# Patient Record
Sex: Female | Born: 1960
Health system: Southern US, Community
[De-identification: ages and names within clinical notes are randomized; demographics above are authoritative.]

## PROBLEM LIST (undated history)

## (undated) DIAGNOSIS — F419 Anxiety disorder, unspecified: Secondary | ICD-10-CM

## (undated) DIAGNOSIS — K219 Gastro-esophageal reflux disease without esophagitis: Secondary | ICD-10-CM

## (undated) DIAGNOSIS — G4700 Insomnia, unspecified: Secondary | ICD-10-CM

## (undated) DIAGNOSIS — I10 Essential (primary) hypertension: Secondary | ICD-10-CM

## (undated) DIAGNOSIS — M8580 Other specified disorders of bone density and structure, unspecified site: Secondary | ICD-10-CM

## (undated) HISTORY — DX: Anxiety disorder, unspecified: F41.9

## (undated) HISTORY — PX: APPENDECTOMY: SHX54

## (undated) HISTORY — DX: Insomnia, unspecified: G47.00

## (undated) HISTORY — DX: Other specified disorders of bone density and structure, unspecified site: M85.80

## (undated) HISTORY — DX: Gastro-esophageal reflux disease without esophagitis: K21.9

---

## 2001-06-06 ENCOUNTER — Other Ambulatory Visit: Admission: RE | Admit: 2001-06-06 | Discharge: 2001-06-06 | Payer: Self-pay | Admitting: Obstetrics and Gynecology

## 2001-08-10 ENCOUNTER — Encounter: Admission: RE | Admit: 2001-08-10 | Discharge: 2001-08-10 | Payer: Self-pay | Admitting: Obstetrics and Gynecology

## 2001-08-10 ENCOUNTER — Encounter: Payer: Self-pay | Admitting: Obstetrics and Gynecology

## 2001-12-12 ENCOUNTER — Encounter: Payer: Self-pay | Admitting: Obstetrics and Gynecology

## 2001-12-12 ENCOUNTER — Encounter: Admission: RE | Admit: 2001-12-12 | Discharge: 2001-12-12 | Payer: Self-pay | Admitting: Obstetrics and Gynecology

## 2003-01-09 ENCOUNTER — Other Ambulatory Visit: Admission: RE | Admit: 2003-01-09 | Discharge: 2003-01-09 | Payer: Self-pay | Admitting: Obstetrics and Gynecology

## 2003-01-23 ENCOUNTER — Encounter: Admission: RE | Admit: 2003-01-23 | Discharge: 2003-01-23 | Payer: Self-pay | Admitting: Obstetrics and Gynecology

## 2003-07-12 ENCOUNTER — Encounter: Admission: RE | Admit: 2003-07-12 | Discharge: 2003-07-12 | Payer: Self-pay | Admitting: Specialist

## 2004-02-28 ENCOUNTER — Encounter: Admission: RE | Admit: 2004-02-28 | Discharge: 2004-02-28 | Payer: Self-pay | Admitting: Obstetrics and Gynecology

## 2004-03-31 ENCOUNTER — Other Ambulatory Visit: Admission: RE | Admit: 2004-03-31 | Discharge: 2004-03-31 | Payer: Self-pay | Admitting: Obstetrics and Gynecology

## 2004-06-24 ENCOUNTER — Ambulatory Visit: Payer: Self-pay | Admitting: Family Medicine

## 2004-06-30 ENCOUNTER — Ambulatory Visit: Payer: Self-pay | Admitting: Family Medicine

## 2004-07-15 ENCOUNTER — Ambulatory Visit: Payer: Self-pay | Admitting: Family Medicine

## 2004-07-30 ENCOUNTER — Ambulatory Visit: Payer: Self-pay | Admitting: Family Medicine

## 2004-09-01 ENCOUNTER — Ambulatory Visit: Payer: Self-pay | Admitting: Family Medicine

## 2004-11-27 ENCOUNTER — Ambulatory Visit: Payer: Self-pay | Admitting: Family Medicine

## 2004-12-02 ENCOUNTER — Ambulatory Visit: Payer: Self-pay

## 2004-12-09 ENCOUNTER — Ambulatory Visit: Payer: Self-pay | Admitting: Licensed Clinical Social Worker

## 2004-12-14 ENCOUNTER — Ambulatory Visit: Payer: Self-pay | Admitting: Licensed Clinical Social Worker

## 2004-12-23 ENCOUNTER — Ambulatory Visit: Payer: Self-pay | Admitting: Licensed Clinical Social Worker

## 2004-12-30 ENCOUNTER — Ambulatory Visit: Payer: Self-pay | Admitting: Licensed Clinical Social Worker

## 2005-01-12 ENCOUNTER — Ambulatory Visit: Payer: Self-pay | Admitting: Licensed Clinical Social Worker

## 2005-02-03 ENCOUNTER — Ambulatory Visit: Payer: Self-pay | Admitting: Licensed Clinical Social Worker

## 2005-03-02 ENCOUNTER — Ambulatory Visit: Payer: Self-pay | Admitting: Licensed Clinical Social Worker

## 2005-04-06 ENCOUNTER — Encounter: Admission: RE | Admit: 2005-04-06 | Discharge: 2005-04-06 | Payer: Self-pay | Admitting: Obstetrics and Gynecology

## 2006-08-16 ENCOUNTER — Ambulatory Visit: Payer: Self-pay | Admitting: Family Medicine

## 2006-08-16 DIAGNOSIS — F411 Generalized anxiety disorder: Secondary | ICD-10-CM

## 2006-08-16 DIAGNOSIS — R42 Dizziness and giddiness: Secondary | ICD-10-CM

## 2006-08-16 DIAGNOSIS — Z860100 Personal history of colon polyps, unspecified: Secondary | ICD-10-CM | POA: Insufficient documentation

## 2006-08-18 LAB — CONVERTED CEMR LAB
Albumin: 3.9 g/dL (ref 3.5–5.2)
Alkaline Phosphatase: 60 units/L (ref 39–117)
Basophils Absolute: 0 10*3/uL (ref 0.0–0.1)
Basophils Relative: 0.1 % (ref 0.0–1.0)
Bilirubin, Direct: 0.1 mg/dL (ref 0.0–0.3)
CO2: 29 meq/L (ref 19–32)
Creatinine, Ser: 0.8 mg/dL (ref 0.4–1.2)
Eosinophils Absolute: 0.1 10*3/uL (ref 0.0–0.6)
Eosinophils Relative: 1.2 % (ref 0.0–5.0)
GFR calc Af Amer: 100 mL/min
Hemoglobin: 12.1 g/dL (ref 12.0–15.0)
MCHC: 33.9 g/dL (ref 30.0–36.0)
Monocytes Absolute: 0.3 10*3/uL (ref 0.2–0.7)
Monocytes Relative: 4.3 % (ref 3.0–11.0)
Neutrophils Relative %: 68.7 % (ref 43.0–77.0)
Potassium: 4.4 meq/L (ref 3.5–5.1)
RBC: 4.3 M/uL (ref 3.87–5.11)
Sodium: 139 meq/L (ref 135–145)
WBC: 7.4 10*3/uL (ref 4.5–10.5)

## 2006-09-02 ENCOUNTER — Encounter: Admission: RE | Admit: 2006-09-02 | Discharge: 2006-09-02 | Payer: Self-pay | Admitting: Obstetrics and Gynecology

## 2006-09-25 ENCOUNTER — Encounter: Payer: Self-pay | Admitting: Internal Medicine

## 2007-02-21 ENCOUNTER — Ambulatory Visit: Payer: Self-pay | Admitting: Family Medicine

## 2007-02-21 DIAGNOSIS — J019 Acute sinusitis, unspecified: Secondary | ICD-10-CM

## 2007-04-17 ENCOUNTER — Telehealth: Payer: Self-pay | Admitting: Family Medicine

## 2007-09-07 ENCOUNTER — Encounter: Admission: RE | Admit: 2007-09-07 | Discharge: 2007-09-07 | Payer: Self-pay | Admitting: Obstetrics and Gynecology

## 2007-10-12 ENCOUNTER — Telehealth: Payer: Self-pay | Admitting: Family Medicine

## 2007-10-16 ENCOUNTER — Ambulatory Visit: Payer: Self-pay | Admitting: Family Medicine

## 2007-10-16 DIAGNOSIS — G4700 Insomnia, unspecified: Secondary | ICD-10-CM

## 2008-04-09 ENCOUNTER — Telehealth: Payer: Self-pay | Admitting: Family Medicine

## 2008-08-19 ENCOUNTER — Ambulatory Visit: Payer: Self-pay | Admitting: Family Medicine

## 2008-09-09 ENCOUNTER — Encounter: Admission: RE | Admit: 2008-09-09 | Discharge: 2008-09-09 | Payer: Self-pay | Admitting: Obstetrics and Gynecology

## 2008-09-24 ENCOUNTER — Ambulatory Visit: Payer: Self-pay | Admitting: Family Medicine

## 2008-09-26 LAB — CONVERTED CEMR LAB
Alkaline Phosphatase: 51 units/L (ref 39–117)
Basophils Relative: 0.3 % (ref 0.0–3.0)
Bilirubin Urine: NEGATIVE
Blood in Urine, dipstick: NEGATIVE
CO2: 30 meq/L (ref 19–32)
Chloride: 104 meq/L (ref 96–112)
Cholesterol: 179 mg/dL (ref 0–200)
Eosinophils Absolute: 0.1 10*3/uL (ref 0.0–0.7)
Eosinophils Relative: 2.7 % (ref 0.0–5.0)
GFR calc non Af Amer: 81.81 mL/min (ref >60)
Glucose, Urine, Semiquant: NEGATIVE
HCT: 38.3 % (ref 36.0–46.0)
HDL: 67.5 mg/dL (ref >39.00)
Lymphocytes Relative: 30.4 % (ref 12.0–46.0)
MCHC: 34 g/dL (ref 30.0–36.0)
MCV: 85.6 fL (ref 78.0–100.0)
Monocytes Relative: 5.4 % (ref 3.0–12.0)
Neutro Abs: 2.8 10*3/uL (ref 1.4–7.7)
Potassium: 3.9 meq/L (ref 3.5–5.1)
RBC: 4.47 M/uL (ref 3.87–5.11)
Specific Gravity, Urine: 1.015
TSH: 1.71 microintl units/mL (ref 0.35–5.50)
Total Bilirubin: 0.8 mg/dL (ref 0.3–1.2)
Total CHOL/HDL Ratio: 3
Total Protein: 7.1 g/dL (ref 6.0–8.3)
VLDL: 9.4 mg/dL (ref 0.0–40.0)

## 2008-10-08 ENCOUNTER — Ambulatory Visit: Payer: Self-pay | Admitting: Family Medicine

## 2008-10-08 DIAGNOSIS — N390 Urinary tract infection, site not specified: Secondary | ICD-10-CM

## 2008-10-08 DIAGNOSIS — Z862 Personal history of diseases of the blood and blood-forming organs and certain disorders involving the immune mechanism: Secondary | ICD-10-CM

## 2008-10-08 DIAGNOSIS — Z8639 Personal history of other endocrine, nutritional and metabolic disease: Secondary | ICD-10-CM

## 2008-10-10 ENCOUNTER — Telehealth: Payer: Self-pay | Admitting: Family Medicine

## 2008-10-10 LAB — CONVERTED CEMR LAB
Alkaline Phosphatase: 49 units/L (ref 39–117)
Ferritin: 8.8 ng/mL — ABNORMAL LOW (ref 10.0–291.0)
Hep B Core Total Ab: NEGATIVE
Hepatitis B Surface Ag: NEGATIVE
Total Protein: 7.3 g/dL (ref 6.0–8.3)

## 2008-10-15 ENCOUNTER — Encounter: Admission: RE | Admit: 2008-10-15 | Discharge: 2008-10-15 | Payer: Self-pay | Admitting: Family Medicine

## 2008-10-29 ENCOUNTER — Telehealth: Payer: Self-pay | Admitting: Family Medicine

## 2008-12-05 ENCOUNTER — Ambulatory Visit: Payer: Self-pay | Admitting: Family Medicine

## 2008-12-25 ENCOUNTER — Ambulatory Visit: Payer: Self-pay | Admitting: Family Medicine

## 2008-12-31 LAB — CONVERTED CEMR LAB
AST: 22 units/L (ref 0–37)
Albumin: 3.7 g/dL (ref 3.5–5.2)
Bilirubin, Direct: 0.1 mg/dL (ref 0.0–0.3)
Total Bilirubin: 0.7 mg/dL (ref 0.3–1.2)
Total Protein: 6.6 g/dL (ref 6.0–8.3)

## 2009-03-07 ENCOUNTER — Telehealth: Payer: Self-pay | Admitting: Family Medicine

## 2009-03-25 ENCOUNTER — Ambulatory Visit: Payer: Self-pay | Admitting: Sports Medicine

## 2009-03-25 DIAGNOSIS — M79609 Pain in unspecified limb: Secondary | ICD-10-CM | POA: Insufficient documentation

## 2009-03-25 DIAGNOSIS — M6740 Ganglion, unspecified site: Secondary | ICD-10-CM | POA: Insufficient documentation

## 2009-03-25 DIAGNOSIS — M25539 Pain in unspecified wrist: Secondary | ICD-10-CM | POA: Insufficient documentation

## 2009-04-28 ENCOUNTER — Ambulatory Visit: Payer: Self-pay | Admitting: Sports Medicine

## 2009-04-28 DIAGNOSIS — Q667 Congenital pes cavus, unspecified foot: Secondary | ICD-10-CM | POA: Insufficient documentation

## 2009-05-29 ENCOUNTER — Ambulatory Visit: Payer: Self-pay | Admitting: Sports Medicine

## 2009-06-05 ENCOUNTER — Emergency Department (HOSPITAL_COMMUNITY): Admission: EM | Admit: 2009-06-05 | Discharge: 2009-06-05 | Payer: Self-pay | Admitting: Emergency Medicine

## 2009-06-06 ENCOUNTER — Ambulatory Visit: Payer: Self-pay | Admitting: Family Medicine

## 2009-06-06 DIAGNOSIS — G43909 Migraine, unspecified, not intractable, without status migrainosus: Secondary | ICD-10-CM | POA: Insufficient documentation

## 2009-08-06 ENCOUNTER — Telehealth: Payer: Self-pay | Admitting: Family Medicine

## 2010-01-13 ENCOUNTER — Telehealth: Payer: Self-pay | Admitting: Family Medicine

## 2010-02-24 NOTE — Progress Notes (Signed)
Summary: Refill Request  Phone Note Refill Request Message from:  Patient on March 07, 2009 8:52 AM  Refills Requested: Medication #1:  TEMAZEPAM 30 MG  CAPS one tab at HS.   Dosage confirmed as above?[facility] [address]  Initial call taken by: Harold Barban,  March 07, 2009 8:53 AM  Follow-up for Phone Call        call in #30 with 5 rf Follow-up by: Nelwyn Salisbury MD,  March 07, 2009 10:18 AM  Additional Follow-up for Phone Call Additional follow up Details #1::        ok done pls let know called in  Additional Follow-up by: Pura Spice, RN,  March 07, 2009 11:16 AM    Prescriptions: TEMAZEPAM 30 MG  CAPS (TEMAZEPAM) one tab at HS  #30 x 5   Entered by:   Pura Spice, RN   Authorized by:   Nelwyn Salisbury MD   Signed by:   Lynann Beaver CMA on 03/07/2009   Method used:   Telephoned to ...       [facility]  [phone]* (retail)       [address] [place], [place]  [address]       Ph: [phone] or [phone]       Fax: [phone]   RxID:   [id number]

## 2010-02-24 NOTE — Progress Notes (Signed)
Summary: refill Temazepam  Phone Note Refill Request Message from:  Fax from Pharmacy on August 06, 2009 11:42 AM  Refills Requested: Medication #1:  TEMAZEPAM 30 MG  CAPS one tab at HS   Dosage confirmed as above?Dosage Confirmed   Supply Requested: 1 month   Last Refilled: 08/01/2009   Notes: early refill requested  Method Requested: Fax to Local Pharmacy Initial call taken by: Raechel Ache, RN,  August 06, 2009 11:43 AM Caller: [facility] [place]  [phone]*  Follow-up for Phone Call        call in #30 with 5 rf Follow-up by: Nelwyn Salisbury MD,  August 06, 2009 6:02 PM  Additional Follow-up for Phone Call Additional follow up Details #1::        Rx faxed to pharmacy Additional Follow-up by: Raechel Ache, RN,  August 07, 2009 8:15 AM    Prescriptions: TEMAZEPAM 30 MG  CAPS (TEMAZEPAM) one tab at HS  #30 x 5   Entered by:   Raechel Ache, RN   Authorized by:   Nelwyn Salisbury MD   Signed by:   Raechel Ache, RN on 08/07/2009   Method used:   Historical   RxID:   [id number]

## 2010-02-24 NOTE — Assessment & Plan Note (Signed)
Summary: L GREAT TOE PAIN  AND R WRIST PAIN X ONE YEAR   Vital Signs:  Patient profile:   50 year old female BP sitting:   138 / 78  Vitals Entered By: Lillia Pauls CMA (March 25, 2009 8:55 AM)  CC:  left big toe pain.  History of Present Illness: Pt presents c/o left hallux pain since valentine's day, about 2 weeks ago.   One year ago she had a stress fracture in the same area that was diagnosed by a podiatrist, at that time she said she was playing tennis one night, and the next morning she could not walk on the foot because of pain, podiatrist did xrays and put her in a padded shoe for 8 weeks.  The toe has been better until Valentines day when she went out and wore high heels.  The next morning she felt a soreness similar to the fracture before.  Then she played tennise 1 or 2 days later, had a good deal of swelling and pain, she used some ice that night.  Since then pain is coming and going, pulsing ache.  She is concerned she has fx it again. She's been avoiding pounding exercises for the past week or 2.   Plays recreational doubles tennis, season starts next week.    Also c/o a bump on her right wrist for a year or so, doesn't hurt much just annoying.  Allergies: No Known Drug Allergies  Past History:  Past Medical History: Last updated: 10/08/2008 Anxiety insomnia sees Dr. Rana Snare for GYN exams  Social History: Reviewed history from 10/16/2007 and no changes required. Married Never Smoked Alcohol use-yes Drug use-no  Review of Systems MS:  Complains of joint pain and joint swelling; denies muscle weakness. Neuro:  Denies numbness, tingling, and weakness.  Physical Exam  General:  Well-developed,well-nourished,in no acute distress; alert,appropriate and cooperative throughout examination Msk:  mild to moderate hallux valgus of both feet Pes cavus b/l large callus on the bottom of the forefoot at the area of the 1st MTP very mild TTP along the distal 1st met from  lateral to dorsal has full ROM in flexion and extension of the toes  U/S shows old medial sesamoid fx healed, small effusion around 1st MTP joint    right radiocarpal joint has small ganglion cyst  This is also confirmed wioth US exam the ganglion communicates with radiocarpal joint and is compressible Neurologic:  strength 5/5 LLE Psych:  Cognition and judgment appear intact. Alert and cooperative with normal attention span and concentration.   Impression & Recommendations:  Problem # 1:  TOE PAIN (ICD-729.5)  left old healed sesamoid fracture probably mild capsulitis around MTP joint  made comforothotic with a sesamoid cut-out to try and take pressure off the area return in 4 weeks for custom orthotics if helping  Orders: [place] LIMITED ([id number])  Problem # 2:  GANGLION CYST, WRIST, RIGHT (ICD-727.41)  clearly connects to joint on u/s gave thumb loop to use while playing tennis and other physical activities  Orders: [place] LIMITED ([id number])  Complete Medication List: 1)  Sertraline Hcl 50 Mg Tabs (Sertraline hcl) .... Once daily 2)  Temazepam 30 Mg Caps (Temazepam) .... One tab at hs  Other Orders: Elastic wrist with thumb loop ([id number])

## 2010-02-24 NOTE — Assessment & Plan Note (Signed)
Summary: fu on anxiety/njr   Vital Signs:  Patient profile:   50 year old female Weight:      128 pounds Temp:     98.6 degrees F oral Resp:     12 per minute BP sitting:   110 / 80  Vitals Entered By: Lynann Beaver CMA (Jun 06, 2009 10:44 AM) CC: Pt seen in ER yesterday with migraine and instructed to see Dr. Clent Ridges today Is Patient Diabetic? No Pain Assessment Patient in pain? no        History of Present Illness: Here for refills on her anxiety med and for HAs. She has frequent HAs, but sometimes thye evolve to where she is lightheaded, nauseated,and has blurred vision. She has never been diagnosed with migraines though until a recent ER visit for one of these HAs. She was told it was a migraine. She had stopped taking Zoloft about 6 months ago, but her anxiety has crept back up. She sleeps well with temazepam.   Current Medications (verified): 1)  Temazepam 30 Mg  Caps (Temazepam) .... One Tab At [facility]  Allergies (verified): No Known Drug Allergies  Past History:  Past Medical History: Reviewed history from 10/08/2008 and no changes required. Anxiety insomnia sees Dr. Rana Snare for GYN exams  Past Surgical History: Reviewed history from 08/16/2006 and no changes required. Appendectomy  Family History: Reviewed history from 10/16/2007 and no changes required. Family History of CAD Female 1st degree relative <50 Family History Diabetes 1st degree relative Family History High cholesterol Family History Hypertension Family History of Stroke F 1st degree relative <60  Review of Systems  The patient denies anorexia, fever, weight loss, weight gain, vision loss, decreased hearing, hoarseness, chest pain, syncope, dyspnea on exertion, peripheral edema, prolonged cough, hemoptysis, abdominal pain, melena, hematochezia, severe indigestion/heartburn, hematuria, incontinence, genital sores, muscle weakness, suspicious skin lesions, transient blindness, difficulty walking, depression,  unusual weight change, abnormal bleeding, enlarged lymph nodes, angioedema, breast masses, and testicular masses.    Physical Exam  General:  Well-developed,well-nourished,in no acute distress; alert,appropriate and cooperative throughout examination Head:  Normocephalic and atraumatic without obvious abnormalities. No apparent alopecia or balding. Eyes:  No corneal or conjunctival inflammation noted. EOMI. Perrla. Funduscopic exam benign, without hemorrhages, exudates or papilledema. Vision grossly normal. Ears:  External ear exam shows no significant lesions or deformities.  Otoscopic examination reveals clear canals, tympanic membranes are intact bilaterally without bulging, retraction, inflammation or discharge. Hearing is grossly normal bilaterally. Nose:  External nasal examination shows no deformity or inflammation. Nasal mucosa are pink and moist without lesions or exudates. Mouth:  Oral mucosa and oropharynx without lesions or exudates.  Teeth in good repair. Neck:  No deformities, masses, or tenderness noted. Lungs:  Normal respiratory effort, chest expands symmetrically. Lungs are clear to auscultation, no crackles or wheezes. Heart:  Normal rate and regular rhythm. S1 and S2 normal without gallop, murmur, click, rub or other extra sounds. Neurologic:  alert & oriented X3, cranial nerves II-XII intact, and gait normal.   Psych:  Oriented X3, memory intact for recent and remote, normally interactive, good eye contact, and moderately anxious.     Impression & Recommendations:  Problem # 1:  MIGRAINE HEADACHE (ICD-346.90)  Her updated medication list for this problem includes:    Imitrex 100 Mg Tabs (Sumatriptan succinate) ..... One as needed for migraines    Aspirin 81 Mg Tbec (Aspirin) ..... Once daily  Problem # 2:  ANXIETY (ICD-300.00)  The following medications were removed from the  medication list:    Sertraline Hcl 50 Mg Tabs (Sertraline hcl) ..... Once daily Her updated  medication list for this problem includes:    Zoloft 50 Mg Tabs (Sertraline hcl) ..... Once daily  Complete Medication List: 1)  Temazepam 30 Mg Caps (Temazepam) .... One tab at hs 2)  Zoloft 50 Mg Tabs (Sertraline hcl) .... Once daily 3)  Imitrex 100 Mg Tabs (Sumatriptan succinate) .... One as needed for migraines 4)  Aspirin 81 Mg Tbec (Aspirin) .... Once daily  Patient Instructions: 1)  Please schedule a follow-up appointment in 1 month.  Prescriptions: IMITREX 100 MG TABS (SUMATRIPTAN SUCCINATE) one as needed for migraines  #12 x 5   Entered and Authorized by:   Nelwyn Salisbury MD   Signed by:   Nelwyn Salisbury MD on 06/06/2009   Method used:   Electronically to        [facility]  [phone]* (retail)       [address] [place], [place]  [address]       Ph: [phone] or [phone]       Fax: [phone]   RxID:   [id number] ZOLOFT 50 MG TABS (SERTRALINE HCL) once daily  #30 x 5   Entered and Authorized by:   Nelwyn Salisbury MD   Signed by:   Nelwyn Salisbury MD on 06/06/2009   Method used:   Electronically to        [facility]  [phone]* (retail)       [address] [place], [place]  [address]       Ph: [phone] or [phone]       Fax: [phone]   RxID:   [id number]

## 2010-02-24 NOTE — Assessment & Plan Note (Signed)
Summary: orthotcis,mc   Vital Signs:  Patient profile:   51 year old female BP sitting:   146 / 89  Vitals Entered By: Lillia Pauls CMA (May 29, 2009 9:21 AM)  History of Present Illness: 50 yo F here for custom orthotics  Patient with capsulitis of 1st MTP and old healed sesamoid fracture Having pain at this area and callus formation on plantar aspect of foot L side. Foot and arch pain have improved. Doing well in temporary sports insoles with scaphoid pads and buildup behind sesamoids. Plays tennis twice a week and goes to work out 2x/week. Has some tingling after tennis matches occasionally from 1st MTP to medial long arch.  Allergies (verified): No Known Drug Allergies  Physical Exam  General:  Well-developed,well-nourished,in no acute distress; alert,appropriate and cooperative throughout examination Msk:  cavus feet minimal pain now at first MTP  bilat some pain along medial arch but not TTP good motion of first MTP bilat thick callus at first MTP FROM all toes, no hallux rigidus   Impression & Recommendations:  Problem # 1:  TOE PAIN (ICD-729.5) Assessment Improved  Patient was fitted for a : standard, cushioned, semi-rigid orthotic. The orthotic was heated and afterward the patient stood on the orthotic blank positioned on the orthotic stand. The patient was positioned in subtalar neutral position and 10 degrees of ankle dorsiflexion in a weight bearing stance. After completion of molding, a stable base was applied to the orthotic blank. The blank was ground to a stable position for weight bearing. Size: 9 red cambray Base: blue med density eva Posting: none Additional orthotic padding: sesamoid cutout on left, padding behind 1st MTP on R Total prep time 40 minutes  Patient felt more comfortable in these, less pressure on 1st MTP.  Lands in neutral now but lands flat footed - this will provide her much more support and cushion.  Orders: [employer],  each unit [phone])  Problem # 2:  TALIPES CAVUS (ICD-754.71) Assessment: Improved  see #1 above.  Orders: [employer], each unit [phone])  Complete Medication List: 1)  Sertraline Hcl 50 Mg Tabs (Sertraline hcl) .... Once daily 2)  Temazepam 30 Mg Caps (Temazepam) .... One tab at hs

## 2010-02-24 NOTE — Assessment & Plan Note (Signed)
Summary: F/U,MC   Vital Signs:  Patient profile:   50 year old female BP sitting:   137 / 84  Vitals Entered By: Lillia Pauls CMA (April 28, 2009 8:52 AM)  History of Present Illness: Pt sts foot is feeling a lot better and much improved from before. She is still playing tennis at least twice a week and also doing some biking for therapy. Does have some tingling after tennis matches from time-to-time. this is   from 1st MTP to med aspect of long arch would like some additional arch support as this helps but does not seem that high in sports insoles   Wrist is better; not fully healed but tolerable  Allergies: No Known Drug Allergies  Physical Exam  General:  Well-developed,well-nourished,in no acute distress; alert,appropriate and cooperative throughout examination Msk:  cavus feet minimal pain now at first MTP  bilat some pain along medial arch but not TTP good motion of first MTP bilat thick callus at first MTP   Impression & Recommendations:  Problem # 1:  TOE PAIN (ICD-729.5) This is much less hurts in some everyday sheos but less w padded sports insoles some padding added to first MTP plantar surface in every day shoes given padding to try in reg shoes  Problem # 2:  TALIPES CAVUS (ICD-754.71) will need custom orthotics will test how much arch we can place by starting w scaphoid pads under sports insoles gait looks good in these  will return for permanent orthotics during next month  Problem # 3:  GANGLION CYST, WRIST, RIGHT (ICD-727.41) having less pain use wrist loop for tennis  Complete Medication List: 1)  Sertraline Hcl 50 Mg Tabs (Sertraline hcl) .... Once daily 2)  Temazepam 30 Mg Caps (Temazepam) .... One tab at hs

## 2010-02-26 NOTE — Progress Notes (Signed)
Summary: Pt req refills for generic Zoloft and Temazepam  Phone Note Refill Request Call back at Home Phone [phone] Message from:  Patient on January 13, 2010 8:58 AM  Refills Requested: Medication #1:  ZOLOFT 50 MG TABS once daily   Dosage confirmed as above?Dosage Confirmed   Brand Name Necessary? No  Medication #2:  TEMAZEPAM 30 MG  CAPS one tab at HS   Dosage confirmed as above?Dosage Confirmed [facility]s [facility]. Pt will sch ov in January. Insurance is changing.    Method Requested: Telephone to Pharmacy Initial call taken by: Lucy Antigua,  January 13, 2010 8:58 AM  Follow-up for Phone Call        call in 3 months of both  Follow-up by: Nelwyn Salisbury MD,  January 14, 2010 8:35 AM  Additional Follow-up for Phone Call Additional follow up Details #1::        done  pt aware Additional Follow-up by: Pura Spice, RN,  January 14, 2010 9:16 AM    New/Updated Medications: ZOLOFT 50 MG TABS (SERTRALINE HCL) once daily Prescriptions: ZOLOFT 50 MG TABS (SERTRALINE HCL) once daily  #30 x 3   Entered by:   Pura Spice, RN   Authorized by:   Nelwyn Salisbury MD   Signed by:   Pura Spice, RN on 01/14/2010   Method used:   Telephoned to ...       [facility]  [phone]* (retail)       [address] [place], [place]  [address]       Ph: [phone] or [phone]       Fax: [phone]   RxID:   [id number] TEMAZEPAM 30 MG  CAPS (TEMAZEPAM) one tab at HS  #30 x 3   Entered by:   Pura Spice, RN   Authorized by:   Nelwyn Salisbury MD   Signed by:   Pura Spice, RN on 01/14/2010   Method used:   Telephoned to ...       [facility]  [phone]* (retail)       [address] [place], [place]  [address]       Ph: [phone] or [phone]       Fax: [phone]   RxID:   [id number]

## 2010-03-09 ENCOUNTER — Other Ambulatory Visit: Payer: Self-pay

## 2010-03-09 DIAGNOSIS — G4700 Insomnia, unspecified: Secondary | ICD-10-CM

## 2010-03-09 MED ORDER — TEMAZEPAM 30 MG PO CAPS: 30.0000 mg | ORAL_CAPSULE | Freq: Every evening | ORAL | Status: DC | PRN

## 2010-03-09 NOTE — Telephone Encounter (Signed)
Ok for 6 months 

## 2010-03-09 NOTE — Telephone Encounter (Signed)
rx faxed [facility]

## 2010-03-20 ENCOUNTER — Encounter: Payer: Self-pay | Admitting: Family Medicine

## 2010-03-24 ENCOUNTER — Ambulatory Visit: Payer: Self-pay | Admitting: Family Medicine

## 2010-04-14 LAB — CBC
HCT: 35.1 % — ABNORMAL LOW (ref 36.0–46.0)
MCV: 82.2 fL (ref 78.0–100.0)
RBC: 4.27 MIL/uL (ref 3.87–5.11)
RDW: 14.1 % (ref 11.5–15.5)

## 2010-04-14 LAB — DIFFERENTIAL
Basophils Relative: 0 % (ref 0–1)
Eosinophils Absolute: 0.1 10*3/uL (ref 0.0–0.7)
Eosinophils Relative: 1 % (ref 0–5)
Lymphs Abs: 1.5 10*3/uL (ref 0.7–4.0)
Neutrophils Relative %: 71 % (ref 43–77)

## 2010-04-14 LAB — POCT I-STAT, CHEM 8
Chloride: 106 mEq/L (ref 96–112)
Glucose, Bld: 117 mg/dL — ABNORMAL HIGH (ref 70–99)
HCT: 40 % (ref 36.0–46.0)
Potassium: 3.5 mEq/L (ref 3.5–5.1)
Sodium: 140 mEq/L (ref 135–145)

## 2010-04-14 LAB — POCT CARDIAC MARKERS: Myoglobin, poc: 60.5 ng/mL (ref 12–200)

## 2010-05-05 ENCOUNTER — Ambulatory Visit (INDEPENDENT_AMBULATORY_CARE_PROVIDER_SITE_OTHER): Payer: BLUE CROSS/BLUE SHIELD | Admitting: Family Medicine

## 2010-05-05 ENCOUNTER — Encounter: Payer: Self-pay | Admitting: Family Medicine

## 2010-05-05 DIAGNOSIS — R079 Chest pain, unspecified: Secondary | ICD-10-CM

## 2010-05-05 DIAGNOSIS — F411 Generalized anxiety disorder: Secondary | ICD-10-CM

## 2010-05-05 DIAGNOSIS — F419 Anxiety disorder, unspecified: Secondary | ICD-10-CM

## 2010-05-05 MED ORDER — TEMAZEPAM 30 MG PO CAPS: 30.0000 mg | ORAL_CAPSULE | Freq: Every evening | ORAL | Status: DC | PRN

## 2010-05-05 MED ORDER — SERTRALINE HCL 50 MG PO TABS: 50.0000 mg | ORAL_TABLET | Freq: Every day | ORAL | Status: DC

## 2010-05-05 NOTE — Progress Notes (Signed)
  Subjective:    Patient ID: Bethany Wade, female    DOB: August 14, 1960, 50 y.o.   MRN: [id number]  HPI Here to refill meds and to ask about running tests on her heart. She occasionally gets chest pains which we have felt were due to GERD in past years. These are not associated with exertion, and she has no SOB or nausea with them. She works out at [employer] and plays tennis almost every day, and none of these activities are limited by chest symptoms. Her anxiety has been well controlled.    Review of Systems  Constitutional: Negative.   Respiratory: Negative.   Cardiovascular: Positive for chest pain. Negative for palpitations and leg swelling.  Gastrointestinal: Negative.   Psychiatric/Behavioral: Negative.        Objective:   Physical Exam  Constitutional: She appears well-developed and well-nourished.  Neck: No thyromegaly present.  Cardiovascular: Normal rate, regular rhythm, normal heart sounds and intact distal pulses.  Exam reveals no gallop and no friction rub.   No murmur heard. Pulmonary/Chest: Effort normal and breath sounds normal.  Lymphadenopathy:    She has no cervical adenopathy.          Assessment & Plan:  Refilled her meds. I do not think her chest discomfort is cardiac in any way, but we will set her up for a stress test soon.

## 2010-05-06 ENCOUNTER — Other Ambulatory Visit (INDEPENDENT_AMBULATORY_CARE_PROVIDER_SITE_OTHER): Payer: BLUE CROSS/BLUE SHIELD

## 2010-05-06 DIAGNOSIS — Z0000 Encounter for general adult medical examination without abnormal findings: Secondary | ICD-10-CM

## 2010-05-06 LAB — CBC WITH DIFFERENTIAL/PLATELET
Eosinophils Absolute: 0.2 10*3/uL (ref 0.0–0.7)
Hemoglobin: 13.2 g/dL (ref 12.0–15.0)
Lymphocytes Relative: 34.1 % (ref 12.0–46.0)
Lymphs Abs: 1.4 10*3/uL (ref 0.7–4.0)
Monocytes Absolute: 0.3 10*3/uL (ref 0.1–1.0)
Monocytes Relative: 6 % (ref 3.0–12.0)
Neutro Abs: 2.3 10*3/uL (ref 1.4–7.7)
Neutrophils Relative %: 55 % (ref 43.0–77.0)
RBC: 4.58 Mil/uL (ref 3.87–5.11)
RDW: 15.1 % — ABNORMAL HIGH (ref 11.5–14.6)
WBC: 4.2 10*3/uL — ABNORMAL LOW (ref 4.5–10.5)

## 2010-05-06 LAB — BASIC METABOLIC PANEL
BUN: 14 mg/dL (ref 6–23)
Chloride: 103 mEq/L (ref 96–112)
GFR: 84.91 mL/min (ref >60.00)
Sodium: 140 mEq/L (ref 135–145)

## 2010-05-06 LAB — URINALYSIS
Hgb urine dipstick: NEGATIVE
Ketones, ur: NEGATIVE
Total Protein, Urine: NEGATIVE
Urine Glucose: NEGATIVE

## 2010-05-06 LAB — LIPID PANEL
Cholesterol: 190 mg/dL (ref 0–200)
LDL Cholesterol: 114 mg/dL — ABNORMAL HIGH (ref 0–99)

## 2010-05-06 LAB — HEPATIC FUNCTION PANEL
AST: 25 U/L (ref 0–37)
Total Protein: 7.1 g/dL (ref 6.0–8.3)

## 2010-05-12 ENCOUNTER — Telehealth: Payer: Self-pay

## 2010-05-12 NOTE — Telephone Encounter (Signed)
Message copied by Madison Hickman on Tue May 12, 2010  7:57 AM ------      Message from: Dwaine Deter      Created: Tue May 12, 2010  5:59 AM       normal

## 2010-05-12 NOTE — Telephone Encounter (Signed)
Pt aware.

## 2010-05-14 ENCOUNTER — Ambulatory Visit (INDEPENDENT_AMBULATORY_CARE_PROVIDER_SITE_OTHER): Payer: BC Managed Care – PPO | Admitting: Physician Assistant

## 2010-05-14 ENCOUNTER — Encounter: Payer: Self-pay | Admitting: Physician Assistant

## 2010-05-14 DIAGNOSIS — R079 Chest pain, unspecified: Secondary | ICD-10-CM

## 2010-05-14 NOTE — Progress Notes (Signed)
Exercise Treadmill Test  Pre-Exercise Testing Evaluation Rhythm: normal sinus  Rate: 76   PR:  .14 QRS:  .08    Test  Exercise Tolerance Test Ordering MD: Gershon Crane M.D  Interpreting MD:  Tereso Newcomer PA-C  Unique Test No: 1  Treadmill:  1  Indication for ETT: chest pain - rule out ischemia  Contraindication to ETT: No   Stress Modality: exercise - treadmill  Cardiac Imaging Performed: non   Protocol: standard Bruce - maximal  Max BP:  164/83  Max MPHR (bpm):  172 85% MPR (bpm):  146  MPHR obtained (bpm):162 % MPHR obtained: 95%  Reached 85% MPHR (min:sec):  7:45 Total Exercise Time (min-sec):11:50  Workload in METS:  14.9 Borg Scale: 17  Reason ETT Terminated:  patient's desire to stop    ST Segment Analysis At Rest: normal ST segments - no evidence of significant ST depression With Exercise: no evidence of significant ST depression  Other Information Arrhythmia:  No Angina during ETT:  absent (0) Quality of ETT:  diagnostic  ETT Interpretation:  normal - no evidence of ischemia by ST analysis  Comments: Excellent exercise tolerance. Normal BP response to exercise. No chest pain. No ECG changes to suggest ischemia.  Recommendations: Follow up with Dr. Clent Ridges as directed.

## 2010-05-29 ENCOUNTER — Telehealth: Payer: Self-pay | Admitting: Family Medicine

## 2010-05-29 NOTE — Telephone Encounter (Signed)
Pt called and has questions re: temazepam. Pls call back asap today.

## 2010-06-01 NOTE — Telephone Encounter (Signed)
There was no question concerning her Temazepam. Pt wanted to ask question concerning her Mother-in-law's diabetic medication. Pt was wanting Dr Clent Ridges to prescribe Rx for mother-in-law who is visiting from [place] & her trip has been extended until May 23,2012 and will run out of meds before that date; she had been to ER and was told that she would need to contact PCP. Explained that Pt would have to have NP OV and be assessed before MD could prescribe medication.  Informed Pt that it would probably be best to contact [place] MDs for this matter, as it would call for cross over on Insurance that probably would not cover a [place] visit. She is going to try and contact [place] physician, and will let us know if she needs to be seen by our office.

## 2010-09-17 ENCOUNTER — Telehealth: Payer: Self-pay | Admitting: Family Medicine

## 2010-09-17 NOTE — Telephone Encounter (Signed)
Refill request for Temazepam 30 mg capsule take 1 qhs prn. Pt last here on 05/14/10 and script last filled on 08/23/10.

## 2010-09-18 MED ORDER — TEMAZEPAM 30 MG PO CAPS: 30.0000 mg | ORAL_CAPSULE | Freq: Every evening | ORAL | Status: DC | PRN

## 2010-09-18 NOTE — Telephone Encounter (Signed)
Script called in

## 2010-09-18 NOTE — Telephone Encounter (Signed)
Call in #30 with 5 rf 

## 2010-11-30 ENCOUNTER — Ambulatory Visit (INDEPENDENT_AMBULATORY_CARE_PROVIDER_SITE_OTHER): Payer: Commercial Managed Care - PPO | Admitting: Family Medicine

## 2010-11-30 ENCOUNTER — Encounter: Payer: Self-pay | Admitting: Family Medicine

## 2010-11-30 VITALS — BP 136/86 | HR 70 | Temp 98.2°F | Ht 64.0 in | Wt 130.0 lb

## 2010-11-30 DIAGNOSIS — M79609 Pain in unspecified limb: Secondary | ICD-10-CM

## 2010-11-30 DIAGNOSIS — M79605 Pain in left leg: Secondary | ICD-10-CM

## 2010-11-30 NOTE — Patient Instructions (Signed)
You have a calf strain/partial tear (tennis leg) Compression sleeve or ace wrap to help with swelling and pain. Icing for 15 minutes at a time 3-4 times a day Elevate above the level of your heart when possible to keep swelling down. Heel lifts either in temporary orthotics or on their own to prevent further strain. Ibuprofen 3-4 tablets three times a day with food for pain, inflammation. Tylenol and/or aleve for pain. Heel raise exercise when able - 3 sets of 10 once a day on both legs - when able, do it while standing only on your left leg. Cycling with low resistance or swimming for cross training is ok now. Follow up with me in 2 weeks for a recheck. Bring your running/tennis shoes and your orthotics to that appointment.

## 2010-12-01 ENCOUNTER — Encounter: Payer: Self-pay | Admitting: Family Medicine

## 2010-12-01 DIAGNOSIS — M79605 Pain in left leg: Secondary | ICD-10-CM | POA: Insufficient documentation

## 2010-12-01 NOTE — Progress Notes (Signed)
Subjective:    Patient ID: Bethany Wade, female    DOB: Jul 30, 1960, 50 y.o.   MRN: [id number]  PCP: Gershon Crane MD  HPI 50 yo F here for left calf injury.  Patient reports she was playing tennis during [place] competition on Saturday. While playing in her last match during tiebreaker (was her final match of weekend) felt a pop in medial aspect of left calf proximally. Had difficulty walking immediately after this - used biofreeze and finished match but was mostly stationary. Had a lot of swelling and bruising following this in next 24 hours. Iced area a lot and elevated and has improved. Now able to walk without a limp but difficult to do a calf raise. No prior left calf injuries. Had problems with ROM of ankle in first 24 hours also but now has full motion. Taking ibuprofen.  Past Medical History  Diagnosis Date  . Anxiety   . Insomnia     Current Outpatient Prescriptions on File Prior to Visit  Medication Sig Dispense Refill  . aspirin 81 MG EC tablet Take 81 mg by mouth daily.        . sertraline (ZOLOFT) 50 MG tablet Take 1 tablet (50 mg total) by mouth daily.  30 tablet  11  . SUMAtriptan (IMITREX) 100 MG tablet Take 100 mg by mouth every 2 (two) hours as needed.        . temazepam (RESTORIL) 30 MG capsule Take 1 capsule (30 mg total) by mouth at bedtime as needed for Sleep.  30 capsule  5  . temazepam (RESTORIL) 30 MG capsule Take 1 capsule (30 mg total) by mouth at bedtime as needed.  30 capsule  5    Past Surgical History  Procedure Date  . Appendectomy     No Known Allergies  History   Social History  . Marital Status: Married    Spouse Name: N/A    Number of Children: N/A  . Years of Education: N/A   Occupational History  . Not on file.   Social History Main Topics  . Smoking status: Never Smoker   . Smokeless tobacco: Not on file  . Alcohol Use: Yes  . Drug Use: No  . Sexually Active: Not on file   Other Topics Concern  . Not on file    Social History Narrative  . No narrative on file    Family History  Problem Relation Age of Onset  . Coronary artery disease      fm hx  . Diabetes      fm hx  . Hyperlipidemia      fm hx  . Hypertension      fm hx  . Stroke      <60 fm hx  . Heart attack Father 55    mi; several brothers with SCD  . Diabetes Father   . Hyperlipidemia Father   . Sudden death Father   . Heart attack Sister   . Hyperlipidemia Sister   . Diabetes Mother   . Hyperlipidemia Mother   . Hyperlipidemia Brother   . Diabetes Paternal Aunt   . Heart attack Paternal Aunt   . Hyperlipidemia Paternal Aunt   . Hypertension Paternal Aunt   . Sudden death Paternal Aunt   . Diabetes Paternal Uncle   . Heart attack Paternal Uncle   . Hyperlipidemia Paternal Uncle   . Hypertension Paternal Uncle   . Sudden death Paternal Uncle   . Diabetes Maternal Grandfather   .  Sudden death Maternal Grandfather   . Hypertension Maternal Grandfather   . Hyperlipidemia Maternal Grandfather   . Heart attack Maternal Grandfather   . Diabetes Paternal Grandmother   . Sudden death Paternal Grandmother   . Hyperlipidemia Paternal Grandmother   . Heart attack Paternal Grandmother   . Hypertension Paternal Grandmother   . Diabetes Paternal Grandfather   . Sudden death Paternal Grandfather   . Heart attack Paternal Grandfather   . Hyperlipidemia Paternal Grandfather   . Hypertension Paternal Grandfather     BP 136/86  Pulse 70  Temp(Src) 98.2 F (36.8 C) (Oral)  Ht 5\' 4"  (1.626 m)  Wt 130 lb (58.968 kg)  BMI 22.31 kg/m2  Review of Systems See HPI above.    Objective:   Physical Exam Gen: NAD  Left lower leg: No gross deformity, swelling, bruising, erythema. TTP medial gastroc proximally but no palpable defect, muscle is soft.  No palpable cords.  No tenderness distal lower leg or at achilles/plantaris. FROM ankle with 5/5 strength but mild pain medial gastroc with full flexion and extension. Able to  do two-footed calf raise but not left one-footed calf raise. Negative thompsons. NVI distally.    Assessment & Plan:  1. Left medial gastroc partial tear/tennis leg - Calf sleeve provided and felt more comfortable with this.  Heel lifts to place in shoes.  Shown home strengthening exercises - start 3 sets of 10 on both legs, advance to left leg calf raises on level ground.  Recommended avoiding cutting sports for next 2 weeks at least - she is in a position where she can rest this now and prepare for spring competition.  Icing, elevation.  Consider formal PT but feel home exercises should be adequate.  Will reassess her progress in 2 weeks - anticipate advancing rehab and starting her back in walking, jogging at that time if continuing to improve with f/u at 6 weeks from injury.  See instructions for further.

## 2010-12-01 NOTE — Assessment & Plan Note (Signed)
Left medial gastroc partial tear/tennis leg - Calf sleeve provided and felt more comfortable with this.  Heel lifts to place in shoes.  Shown home strengthening exercises - start 3 sets of 10 on both legs, advance to left leg calf raises on level ground.  Recommended avoiding cutting sports for next 2 weeks at least - she is in a position where she can rest this now and prepare for spring competition.  Icing, elevation.  Consider formal PT but feel home exercises should be adequate.  Will reassess her progress in 2 weeks - anticipate advancing rehab and starting her back in walking, jogging at that time if continuing to improve with f/u at 6 weeks from injury.  See instructions for further.

## 2010-12-14 ENCOUNTER — Ambulatory Visit (INDEPENDENT_AMBULATORY_CARE_PROVIDER_SITE_OTHER): Payer: Commercial Managed Care - PPO | Admitting: Family Medicine

## 2010-12-14 ENCOUNTER — Encounter: Payer: Self-pay | Admitting: Family Medicine

## 2010-12-14 DIAGNOSIS — M25512 Pain in left shoulder: Secondary | ICD-10-CM | POA: Insufficient documentation

## 2010-12-14 DIAGNOSIS — M79609 Pain in unspecified limb: Secondary | ICD-10-CM

## 2010-12-14 DIAGNOSIS — M79605 Pain in left leg: Secondary | ICD-10-CM

## 2010-12-14 DIAGNOSIS — M25519 Pain in unspecified shoulder: Secondary | ICD-10-CM

## 2010-12-14 NOTE — Assessment & Plan Note (Signed)
Left shoulder rotator cuff impingement - theraband provided and home exercises shown.  Consider PT, injection if not improving as expected over next month.  Avoid overhead and reaching activities as much as possible.

## 2010-12-14 NOTE — Assessment & Plan Note (Signed)
Left medial gastroc partial tear/tennis leg - improved - continue calf sleeve regularly for next 4 weeks then for following 6 weeks with sports.  Continue home exercises - advance to using backpack and doing exercises on a step when she can comfortably do 3 x 10 single leg calf raises on level ground.  Can then start walk:jog program also.  Icing, tylenol as needed.  F/u in 1 month or prn.

## 2010-12-14 NOTE — Progress Notes (Signed)
Subjective:    Patient ID: Bethany Wade, female    DOB: 09-May-1960, 50 y.o.   MRN: [id number]  PCP: Gershon Crane MD  HPI  50 yo F here for f/u left calf injury.  11/5: Patient reports she was playing tennis during [place] competition on Saturday. While playing in her last match during tiebreaker (was her final match of weekend) felt a pop in medial aspect of left calf proximally. Had difficulty walking immediately after this - used biofreeze and finished match but was mostly stationary. Had a lot of swelling and bruising following this in next 24 hours. Iced area a lot and elevated and has improved. Now able to walk without a limp but difficult to do a calf raise. No prior left calf injuries. Had problems with ROM of ankle in first 24 hours also but now has full motion. Taking ibuprofen.  11/19: Patient has improved quite a bit from last visit. Now able to do single leg calf raise on left leg with mild pain but hard to do 3 sets of 10 due to pain. Wearing calf sleeve regularly. No longer taking medicines or icing the area. Wearing shoes with a good heel lift. Also noted today she's had left shoulder pain worse with overhead activities. No night pain. Is left handed. Worse when she was playing tennis before her calf injury, also worse with reaching behind her.  Past Medical History  Diagnosis Date  . Anxiety   . Insomnia     Current Outpatient Prescriptions on File Prior to Visit  Medication Sig Dispense Refill  . aspirin 81 MG EC tablet Take 81 mg by mouth daily.        . sertraline (ZOLOFT) 50 MG tablet Take 1 tablet (50 mg total) by mouth daily.  30 tablet  11  . SUMAtriptan (IMITREX) 100 MG tablet Take 100 mg by mouth every 2 (two) hours as needed.        . temazepam (RESTORIL) 30 MG capsule Take 1 capsule (30 mg total) by mouth at bedtime as needed for Sleep.  30 capsule  5  . temazepam (RESTORIL) 30 MG capsule Take 1 capsule (30 mg total) by mouth at bedtime as  needed.  30 capsule  5    Past Surgical History  Procedure Date  . Appendectomy     No Known Allergies  History   Social History  . Marital Status: Married    Spouse Name: N/A    Number of Children: N/A  . Years of Education: N/A   Occupational History  . Not on file.   Social History Main Topics  . Smoking status: Never Smoker   . Smokeless tobacco: Not on file  . Alcohol Use: Yes  . Drug Use: No  . Sexually Active: Not on file   Other Topics Concern  . Not on file   Social History Narrative  . No narrative on file    Family History  Problem Relation Age of Onset  . Coronary artery disease      fm hx  . Diabetes      fm hx  . Hyperlipidemia      fm hx  . Hypertension      fm hx  . Stroke      <60 fm hx  . Heart attack Father 79    mi; several brothers with SCD  . Diabetes Father   . Hyperlipidemia Father   . Sudden death Father   . Heart attack Sister   .  Hyperlipidemia Sister   . Diabetes Mother   . Hyperlipidemia Mother   . Hyperlipidemia Brother   . Diabetes Paternal Aunt   . Heart attack Paternal Aunt   . Hyperlipidemia Paternal Aunt   . Hypertension Paternal Aunt   . Sudden death Paternal Aunt   . Diabetes Paternal Uncle   . Heart attack Paternal Uncle   . Hyperlipidemia Paternal Uncle   . Hypertension Paternal Uncle   . Sudden death Paternal Uncle   . Diabetes Maternal Grandfather   . Sudden death Maternal Grandfather   . Hypertension Maternal Grandfather   . Hyperlipidemia Maternal Grandfather   . Heart attack Maternal Grandfather   . Diabetes Paternal Grandmother   . Sudden death Paternal Grandmother   . Hyperlipidemia Paternal Grandmother   . Heart attack Paternal Grandmother   . Hypertension Paternal Grandmother   . Diabetes Paternal Grandfather   . Sudden death Paternal Grandfather   . Heart attack Paternal Grandfather   . Hyperlipidemia Paternal Grandfather   . Hypertension Paternal Grandfather     BP 130/90  Pulse 78   Temp(Src) 98.2 F (36.8 C) (Oral)  Ht 5\' 4"  (1.626 m)  Wt 131 lb (59.421 kg)  BMI 22.49 kg/m2  Review of Systems  See HPI above.    Objective:   Physical Exam  Gen: NAD  Left lower leg: No gross deformity, swelling, bruising, erythema. Minimal TTP medial gastroc proximally but no palpable defect, muscle is soft.  No palpable cords.  No tenderness distal lower leg or at achilles/plantaris. FROM ankle with 5/5 strength without pain. Now able to do single leg calf raise on left. Negative thompsons. NVI distally.  L shoulder: No swelling, ecchymoses.  No gross deformity. No TTP. FROM. Mildly positive Hawkins, Neers.  + painful arc Negative Speeds, Yergasons. 5/5 strength with empty can and resisted internal/external rotation - mild pain with empty can and ER. NV intact distally.    Assessment & Plan:  1. Left medial gastroc partial tear/tennis leg - improved - continue calf sleeve regularly for next 4 weeks then for following 6 weeks with sports.  Continue home exercises - advance to using backpack and doing exercises on a step when she can comfortably do 3 x 10 single leg calf raises on level ground.  Can then start walk:jog program also.  Icing, tylenol as needed.  F/u in 1 month or prn.  2. Left shoulder rotator cuff impingement - theraband provided and home exercises shown.  Consider PT, injection if not improving as expected over next month.  Avoid overhead and reaching activities as much as possible.

## 2010-12-14 NOTE — Patient Instructions (Signed)
You are doing great in recovering from your calf strain. Continue wearing the sleeve for next 4 weeks as you have been THEN only wear it with sports for the following 6 weeks. Ice and tylenol/aleve as needed at this point. Continue with your exercises - 3 sets of 10 one leg - when you can do this without pain, add a backpack of weights and/or do it on a step as I showed you. When you get to this point also (one legged calf raises without pain), you can start a walk/jog program 1:1 minute for total 10 minutes - every other day increase total time by 5 minutes and jog time by 1-2 minutes per interval. Your orthotics are in pretty good shape - you will likely get another year out of these before you need a replacement - if they crack at all we can make you new ones.  You have rotator cuff impingement Try to avoid painful activities (overhead activities, lifting with extended arm) as much as possible. Aleve and/or tylenol as needed for pain Subacromial injection may be beneficial to help with pain and to decrease inflammation. Home exercise program as discussed with theraband and scapular stabilization exercises - these are very important for long term relief even if an injection was given. If not improving at follow-up we will consider ultrasound and/or physical therapy.

## 2011-03-17 ENCOUNTER — Encounter: Payer: Self-pay | Admitting: Family Medicine

## 2011-03-17 ENCOUNTER — Ambulatory Visit (INDEPENDENT_AMBULATORY_CARE_PROVIDER_SITE_OTHER): Payer: Commercial Managed Care - PPO | Admitting: Family Medicine

## 2011-03-17 VITALS — BP 114/74 | HR 103 | Temp 98.4°F | Wt 131.0 lb

## 2011-03-17 DIAGNOSIS — F411 Generalized anxiety disorder: Secondary | ICD-10-CM

## 2011-03-17 DIAGNOSIS — F419 Anxiety disorder, unspecified: Secondary | ICD-10-CM

## 2011-03-17 DIAGNOSIS — Z823 Family history of stroke: Secondary | ICD-10-CM

## 2011-03-17 DIAGNOSIS — G4700 Insomnia, unspecified: Secondary | ICD-10-CM

## 2011-03-17 MED ORDER — ALPRAZOLAM 0.5 MG PO TABS: 0.5000 mg | ORAL_TABLET | Freq: Three times a day (TID) | ORAL | Status: AC | PRN

## 2011-03-17 MED ORDER — TEMAZEPAM 30 MG PO CAPS: 30.0000 mg | ORAL_CAPSULE | Freq: Every evening | ORAL | Status: DC | PRN

## 2011-03-17 NOTE — Progress Notes (Signed)
  Subjective:    Patient ID: Bethany Wade, female    DOB: 03-Apr-1960, 51 y.o.   MRN: [id number]  HPI Here for med refills and with questions. She needs more temazepam, which works well for sleep. She also will be flying soon, and she has used Xanax in the past for travel anxiety. Lastly she is worried about the risk of stroke. Her only real risk factor for stroke is a positive family hx. She has had a father, several uncles, and a brother who have had strokes in their 8s. A good friend of hers (who is also a patient of mine) had a stroke due to an antiphospholipid syndrome, and Jessenya wants to be checked for this.    Review of Systems  Constitutional: Negative.   Respiratory: Negative.   Cardiovascular: Negative.   Psychiatric/Behavioral: The patient is nervous/anxious.        Objective:   Physical Exam  Constitutional: She appears well-developed and well-nourished.  Neck: Neck supple.  Cardiovascular: Normal rate, regular rhythm, normal heart sounds and intact distal pulses.   Pulmonary/Chest: Effort normal and breath sounds normal.  Lymphadenopathy:    She has no cervical adenopathy.  Psychiatric: She has a normal mood and affect. Her behavior is normal. Thought content normal.          Assessment & Plan:  Refilled Temazepam. Given Xanax for flying. Get a hypercoagulability panel.

## 2011-03-19 LAB — HYPERCOAGULABLE PANEL, COMPREHENSIVE
Beta-2-Glycoprotein I IgA: 4 A Units (ref <20)
Beta-2-Glycoprotein I IgM: 1 M Units (ref <20)
DRVVT: 35.2 secs (ref 34.1–42.2)
Lupus Anticoagulant: NOT DETECTED
Protein S Activity: 101 % (ref 69–129)

## 2011-03-24 NOTE — Progress Notes (Signed)
Quick Note:  Left voice message ______ 

## 2011-09-23 ENCOUNTER — Telehealth: Payer: Self-pay | Admitting: Family Medicine

## 2011-09-23 MED ORDER — TEMAZEPAM 30 MG PO CAPS: 30.0000 mg | ORAL_CAPSULE | Freq: Every evening | ORAL | Status: DC | PRN

## 2011-09-23 NOTE — Telephone Encounter (Signed)
Patient called stating that she need a refill of her temazepam sent to [facility] on [address]. Please assist.

## 2011-09-23 NOTE — Telephone Encounter (Signed)
Call in #30 with 5 rf 

## 2011-09-23 NOTE — Telephone Encounter (Signed)
Rx called in to pharmacy. 

## 2012-01-13 ENCOUNTER — Ambulatory Visit (INDEPENDENT_AMBULATORY_CARE_PROVIDER_SITE_OTHER): Payer: Commercial Managed Care - PPO | Admitting: Family Medicine

## 2012-01-13 ENCOUNTER — Encounter: Payer: Self-pay | Admitting: Family Medicine

## 2012-01-13 VITALS — BP 110/74 | HR 76 | Temp 97.7°F | Wt 125.0 lb

## 2012-01-13 DIAGNOSIS — G4700 Insomnia, unspecified: Secondary | ICD-10-CM

## 2012-01-13 DIAGNOSIS — M1990 Unspecified osteoarthritis, unspecified site: Secondary | ICD-10-CM

## 2012-01-13 DIAGNOSIS — M129 Arthropathy, unspecified: Secondary | ICD-10-CM

## 2012-01-13 DIAGNOSIS — E785 Hyperlipidemia, unspecified: Secondary | ICD-10-CM

## 2012-01-13 LAB — BASIC METABOLIC PANEL
BUN: 14 mg/dL (ref 6–23)
CO2: 29 mEq/L (ref 19–32)
Chloride: 103 mEq/L (ref 96–112)
Creatinine, Ser: 0.7 mg/dL (ref 0.4–1.2)
Glucose, Bld: 87 mg/dL (ref 70–99)
Potassium: 3.8 mEq/L (ref 3.5–5.1)

## 2012-01-13 LAB — CBC WITH DIFFERENTIAL/PLATELET
Basophils Absolute: 0 10*3/uL (ref 0.0–0.1)
Eosinophils Absolute: 0.1 10*3/uL (ref 0.0–0.7)
Lymphocytes Relative: 40.8 % (ref 12.0–46.0)
Lymphs Abs: 1.7 10*3/uL (ref 0.7–4.0)
MCHC: 33.6 g/dL (ref 30.0–36.0)
Monocytes Relative: 5.8 % (ref 3.0–12.0)
Neutro Abs: 2.1 10*3/uL (ref 1.4–7.7)
Platelets: 182 10*3/uL (ref 150.0–400.0)
RDW: 14.3 % (ref 11.5–14.6)

## 2012-01-13 LAB — LIPID PANEL
Cholesterol: 218 mg/dL — ABNORMAL HIGH (ref 0–200)
VLDL: 9.8 mg/dL (ref 0.0–40.0)

## 2012-01-13 LAB — HEPATIC FUNCTION PANEL
ALT: 18 U/L (ref 0–35)
AST: 27 U/L (ref 0–37)
Albumin: 4.1 g/dL (ref 3.5–5.2)
Total Protein: 7.4 g/dL (ref 6.0–8.3)

## 2012-01-13 LAB — POCT URINALYSIS DIPSTICK
Leukocytes, UA: NEGATIVE
Protein, UA: NEGATIVE
Urobilinogen, UA: 0.2
pH, UA: 7

## 2012-01-13 LAB — TSH: TSH: 1.97 u[IU]/mL (ref 0.35–5.50)

## 2012-01-13 LAB — SEDIMENTATION RATE: Sed Rate: 6 mm/hr (ref 0–22)

## 2012-01-13 MED ORDER — DICLOFENAC SODIUM 75 MG PO TBEC: 75.0000 mg | DELAYED_RELEASE_TABLET | Freq: Two times a day (BID) | ORAL | Status: DC

## 2012-01-13 MED ORDER — TEMAZEPAM 30 MG PO CAPS: 30.0000 mg | ORAL_CAPSULE | Freq: Every evening | ORAL | Status: DC | PRN

## 2012-01-13 NOTE — Progress Notes (Signed)
  Subjective:    Patient ID: Bethany Wade, female    DOB: 1961-01-13, 51 y.o.   MRN: [id number]  HPI Here for refills and to discuss joint pains. Over the past few months she has had stiffness and pain in numerous joints, especially the hands. She has tried Advil with mixed results. Some swelling is seen in the fingers.    Review of Systems  Constitutional: Negative.   Respiratory: Negative.   Cardiovascular: Negative.   Musculoskeletal: Positive for arthralgias.       Objective:   Physical Exam  Constitutional: She appears well-developed and well-nourished.  Cardiovascular: Normal rate, regular rhythm, normal heart sounds and intact distal pulses.   Pulmonary/Chest: Effort normal and breath sounds normal.  Musculoskeletal:       The PIPs and DIPs are tender in both hands, no swelling.           Assessment & Plan:  She has arthritis, probably osteoarthritis. Get labs to look for inflammatory etiologies. Try Diclofenac. Refilled Temazepam.

## 2012-01-14 LAB — ANA: Anti Nuclear Antibody(ANA): NEGATIVE

## 2012-03-20 ENCOUNTER — Telehealth: Payer: Self-pay | Admitting: Family Medicine

## 2012-03-20 NOTE — Telephone Encounter (Signed)
Refill request for Sertraline HCL 50 mg take 1 po qd and send to [facility].

## 2012-03-20 NOTE — Telephone Encounter (Signed)
Patient calling about her Zoloft.  She has spoken with the pharmacy, her refill has expired.  They were to send over a request but she insisted that a note be added to her chart.  States that [place] is going [place] and needs the medication. Per note below, same has already been taken care of. Patient was advised of same and very appreciative.

## 2012-03-21 MED ORDER — SERTRALINE HCL 50 MG PO TABS: 50.0000 mg | ORAL_TABLET | Freq: Every day | ORAL | Status: DC

## 2012-03-21 NOTE — Telephone Encounter (Signed)
I sent script e-scribe. 

## 2012-03-21 NOTE — Telephone Encounter (Signed)
Call in Zoloft 50 mg a day for one year

## 2012-04-17 ENCOUNTER — Other Ambulatory Visit: Payer: Self-pay | Admitting: Family Medicine

## 2012-04-17 NOTE — Telephone Encounter (Signed)
No, she has refills to last until June

## 2012-04-17 NOTE — Telephone Encounter (Signed)
Pt is out of temazepam

## 2012-07-20 ENCOUNTER — Telehealth: Payer: Self-pay | Admitting: Family Medicine

## 2012-07-20 NOTE — Telephone Encounter (Signed)
Pt needs refill of temazepam (RESTORIL) 30 MG capsule. Pt takes last one today and state she cannot sleep w/out. Pt has made fup on meds for July 1. Pharm: [facility] [address]

## 2012-07-20 NOTE — Telephone Encounter (Signed)
Refill for 6 months. 

## 2012-07-21 MED ORDER — TEMAZEPAM 30 MG PO CAPS: 30.0000 mg | ORAL_CAPSULE | Freq: Every evening | ORAL | Status: DC | PRN

## 2012-07-21 NOTE — Telephone Encounter (Signed)
I called in script and left voice message for pt.

## 2012-07-25 ENCOUNTER — Ambulatory Visit (INDEPENDENT_AMBULATORY_CARE_PROVIDER_SITE_OTHER): Payer: Commercial Managed Care - PPO | Admitting: Family Medicine

## 2012-07-25 ENCOUNTER — Encounter: Payer: Self-pay | Admitting: Family Medicine

## 2012-07-25 VITALS — BP 124/86 | HR 91 | Temp 98.8°F | Wt 124.0 lb

## 2012-07-25 DIAGNOSIS — F411 Generalized anxiety disorder: Secondary | ICD-10-CM

## 2012-07-25 DIAGNOSIS — E785 Hyperlipidemia, unspecified: Secondary | ICD-10-CM | POA: Insufficient documentation

## 2012-07-25 DIAGNOSIS — G4700 Insomnia, unspecified: Secondary | ICD-10-CM

## 2012-07-25 DIAGNOSIS — R5383 Other fatigue: Secondary | ICD-10-CM

## 2012-07-25 DIAGNOSIS — R5381 Other malaise: Secondary | ICD-10-CM

## 2012-07-25 LAB — POCT URINALYSIS DIPSTICK
Glucose, UA: NEGATIVE
Nitrite, UA: NEGATIVE
Urobilinogen, UA: 0.2

## 2012-07-25 LAB — CBC WITH DIFFERENTIAL/PLATELET
Basophils Absolute: 0 10*3/uL (ref 0.0–0.1)
Lymphocytes Relative: 32 % (ref 12.0–46.0)
Monocytes Relative: 5.3 % (ref 3.0–12.0)
Platelets: 183 10*3/uL (ref 150.0–400.0)
RDW: 15.2 % — ABNORMAL HIGH (ref 11.5–14.6)

## 2012-07-25 LAB — BASIC METABOLIC PANEL
CO2: 27 mEq/L (ref 19–32)
Calcium: 9.7 mg/dL (ref 8.4–10.5)
Creatinine, Ser: 0.8 mg/dL (ref 0.4–1.2)
Glucose, Bld: 77 mg/dL (ref 70–99)

## 2012-07-25 LAB — LIPID PANEL
Cholesterol: 204 mg/dL — ABNORMAL HIGH (ref 0–200)
Total CHOL/HDL Ratio: 3
Triglycerides: 82 mg/dL (ref 0.0–149.0)

## 2012-07-25 LAB — LDL CHOLESTEROL, DIRECT: Direct LDL: 107.4 mg/dL

## 2012-07-25 LAB — HEPATIC FUNCTION PANEL
Albumin: 4.4 g/dL (ref 3.5–5.2)
Bilirubin, Direct: 0 mg/dL (ref 0.0–0.3)
Total Protein: 7.7 g/dL (ref 6.0–8.3)

## 2012-07-25 LAB — TSH: TSH: 0.76 u[IU]/mL (ref 0.35–5.50)

## 2012-07-25 NOTE — Progress Notes (Signed)
  Subjective:    Patient ID: Bethany Wade, female    DOB: [birth year], 52 y.o.   MRN: [id number]  HPI Here to follow up on anxiety and high chol. She feels some fatigue and wonders if she may be anemic. She is active and playing tennis. She sleeps well.    Review of Systems  Constitutional: Positive for fatigue.  Respiratory: Negative.   Cardiovascular: Negative.   Psychiatric/Behavioral: Negative.        Objective:   Physical Exam  Constitutional: She is oriented to person, place, and time. She appears well-developed and well-nourished.  Cardiovascular: Normal rate, regular rhythm, normal heart sounds and intact distal pulses.   Pulmonary/Chest: Effort normal and breath sounds normal.  Neurological: She is alert and oriented to person, place, and time.  Psychiatric: She has a normal mood and affect. Her behavior is normal. Thought content normal.          Assessment & Plan:  She seems to be doing well. Get fasting labs today

## 2012-07-26 NOTE — Progress Notes (Signed)
Quick Note:  I spoke with pt and she is going to pick up a copy of the lab results. ______

## 2013-01-11 ENCOUNTER — Telehealth: Payer: Self-pay | Admitting: Family Medicine

## 2013-01-11 NOTE — Telephone Encounter (Signed)
Refill request for Temazepam 30 mg take 1 po qhs prn and [facility]

## 2013-01-12 MED ORDER — TEMAZEPAM 30 MG PO CAPS: 30.0000 mg | ORAL_CAPSULE | Freq: Every evening | ORAL | Status: DC | PRN

## 2013-01-12 NOTE — Telephone Encounter (Signed)
I called in script 

## 2013-01-12 NOTE — Telephone Encounter (Signed)
Call in #30 with 5 rf 

## 2013-02-05 ENCOUNTER — Ambulatory Visit (INDEPENDENT_AMBULATORY_CARE_PROVIDER_SITE_OTHER): Payer: Commercial Managed Care - PPO | Admitting: Family Medicine

## 2013-02-05 VITALS — BP 116/84 | HR 66 | Temp 98.4°F | Resp 16 | Wt 128.0 lb

## 2013-02-05 DIAGNOSIS — K219 Gastro-esophageal reflux disease without esophagitis: Secondary | ICD-10-CM

## 2013-02-05 NOTE — Patient Instructions (Signed)
prilosec 20mg  daily  Follow up with your doctor in 1-2 weeks or sooner if concerns   Diet for Gastroesophageal Reflux Disease, Adult Reflux (acid reflux) is when acid from your stomach flows up into the esophagus. When acid comes in contact with the esophagus, the acid causes irritation and soreness (inflammation) in the esophagus. When reflux happens often or so severely that it causes damage to the esophagus, it is called gastroesophageal reflux disease (GERD). Nutrition therapy can help ease the discomfort of GERD. FOODS OR DRINKS TO AVOID OR LIMIT  Smoking or chewing tobacco. Nicotine is one of the most potent stimulants to acid production in the gastrointestinal tract.  Caffeinated and decaffeinated coffee and black tea.  Regular or low-calorie carbonated beverages or energy drinks (caffeine-free carbonated beverages are allowed).   Strong spices, such as black pepper, white pepper, red pepper, cayenne, curry powder, and chili powder.  Peppermint or spearmint.  Chocolate.  High-fat foods, including meats and fried foods. Extra added fats including oils, butter, salad dressings, and nuts. Limit these to less than 8 tsp per day.  Fruits and vegetables if they are not tolerated, such as citrus fruits or tomatoes.  Alcohol.  Any food that seems to aggravate your condition. If you have questions regarding your diet, call your caregiver or a registered dietitian. OTHER THINGS THAT MAY HELP GERD INCLUDE:   Eating your meals slowly, in a relaxed setting.  Eating 5 to 6 small meals per day instead of 3 large meals.  Eliminating food for a period of time if it causes distress.  Not lying down until 3 hours after eating a meal.  Keeping the head of your bed raised 6 to 9 inches (15 to 23 cm) by using a foam wedge or blocks under the legs of the bed. Lying flat may make symptoms worse.  Being physically active. Weight loss may be helpful in reducing reflux in overweight or obese  adults.  Wear loose fitting clothing EXAMPLE MEAL PLAN This meal plan is approximately 2,000 calories based on [email or web address] meal planning guidelines. Breakfast   cup cooked oatmeal.  1 cup strawberries.  1 cup low-fat milk.  1 oz almonds. Snack  1 cup cucumber slices.  6 oz yogurt (made from low-fat or fat-free milk). Lunch  2 slice whole-wheat bread.  2 oz sliced [place].  2 tsp mayonnaise.  1 cup blueberries.  1 cup snap peas. Snack  6 whole-wheat crackers.  1 oz string cheese. Dinner   cup brown rice.  1 cup mixed veggies.  1 tsp olive oil.  3 oz grilled fish. Document Released: 01/11/2005 Document Revised: 04/05/2011 Document Reviewed: 11/27/2010 [facility] [employer] Patient Information 2014 [place], [place].

## 2013-02-05 NOTE — Progress Notes (Signed)
Chief Complaint  Patient presents with  . upper abdominal pain    x 3 days; pt describes it as "trapped gas"    HPI:  Abd Pain: -has been drinking more wine and eating different, more coffee and yogurt for last few days and lemon - and this started after a large meal of these - now a little better, worse at night and with acid in mouth and reflux -for last several days has had some acid reflux and epigastric discomfort -feels like rumbling in stomach like gas sometimes, not a severe pain - more was a discomfort and worse three days ago, now improving -comes and goes, worse after eating some things -denies: fevers, vomiting, chills, diarrhea, blood in stools  ROS: See pertinent positives and negatives per HPI.  Past Medical History  Diagnosis Date  . Anxiety   . Insomnia     Past Surgical History  Procedure Laterality Date  . Appendectomy      Family History  Problem Relation Age of Onset  . Coronary artery disease      fm hx  . Diabetes      fm hx  . Hyperlipidemia      fm hx  . Hypertension      fm hx  . Stroke      <60 fm hx  . Heart attack Father 52    mi; several brothers with SCD  . Diabetes Father   . Hyperlipidemia Father   . Sudden death Father   . Heart attack Sister   . Hyperlipidemia Sister   . Diabetes Mother   . Hyperlipidemia Mother   . Hyperlipidemia Brother   . Diabetes Paternal Aunt   . Heart attack Paternal Aunt   . Hyperlipidemia Paternal Aunt   . Hypertension Paternal Aunt   . Sudden death Paternal Aunt   . Diabetes Paternal Uncle   . Heart attack Paternal Uncle   . Hyperlipidemia Paternal Uncle   . Hypertension Paternal Uncle   . Sudden death Paternal Uncle   . Diabetes Maternal Grandfather   . Sudden death Maternal Grandfather   . Hypertension Maternal Grandfather   . Hyperlipidemia Maternal Grandfather   . Heart attack Maternal Grandfather   . Diabetes Paternal Grandmother   . Sudden death Paternal Grandmother   .  Hyperlipidemia Paternal Grandmother   . Heart attack Paternal Grandmother   . Hypertension Paternal Grandmother   . Diabetes Paternal Grandfather   . Sudden death Paternal Grandfather   . Heart attack Paternal Grandfather   . Hyperlipidemia Paternal Grandfather   . Hypertension Paternal Grandfather     History   Social History  . Marital Status: Married    Spouse Name: N/A    Number of Children: N/A  . Years of Education: N/A   Social History Main Topics  . Smoking status: Never Smoker   . Smokeless tobacco: Never Used  . Alcohol Use: 4.2 oz/week    7 Glasses of wine per week  . Drug Use: No  . Sexual Activity: Not on file   Other Topics Concern  . Not on file   Social History Narrative  . No narrative on file    Current outpatient prescriptions:aspirin 81 MG EC tablet, Take 81 mg by mouth daily.  , Disp: , Rfl: ;  sertraline (ZOLOFT) 50 MG tablet, Take 1 tablet (50 mg total) by mouth daily., Disp: 30 tablet, Rfl: 11;  temazepam (RESTORIL) 30 MG capsule, Take 1 capsule (30 mg total) by mouth  at bedtime as needed for sleep., Disp: 30 capsule, Rfl: 5  EXAM:  Filed Vitals:   02/05/13 0913  BP: 116/84  Pulse: 66  Temp: 98.4 F (36.9 C)  Resp: 16    Body mass index is 21.96 kg/(m^2).  GENERAL: vitals reviewed and listed above, alert, oriented, appears well hydrated and in no acute distress  HEENT: atraumatic, conjunttiva clear, no obvious abnormalities on inspection of external nose and ears  NECK: no obvious masses on inspection  LUNGS: clear to auscultation bilaterally, no wheezes, rales or rhonchi, good air movement  CV: HRRR, no peripheral edema  ABD: BS+, soft, NTTP, no rebound or guarding  MS: moves all extremities without noticeable abnormality  PSYCH: pleasant and cooperative, no obvious depression or anxiety  ASSESSMENT AND PLAN:  Discussed the following assessment and plan:  GERD (gastroesophageal reflux disease)  -we discussed possible  serious and likely etiologies, workup and treatment, treatment risks and return precautions -after this discussion, Bethany Wade opted for PPI and dietary changes for likely GERD aggravated by recent change in diet and close follow up with PCP to ensure improving -of course, we advised Bethany Wade  to return or notify a doctor immediately if symptoms worsen or persist or new concerns arise.  -Patient advised to return or notify a doctor immediately if symptoms worsen or persist or new concerns arise.  Patient Instructions  prilosec 20mg  daily  Follow up with your doctor in 1-2 weeks or sooner if concerns   Diet for Gastroesophageal Reflux Disease, Adult Reflux (acid reflux) is when acid from your stomach flows up into the esophagus. When acid comes in contact with the esophagus, the acid causes irritation and soreness (inflammation) in the esophagus. When reflux happens often or so severely that it causes damage to the esophagus, it is called gastroesophageal reflux disease (GERD). Nutrition therapy can help ease the discomfort of GERD. FOODS OR DRINKS TO AVOID OR LIMIT  Smoking or chewing tobacco. Nicotine is one of the most potent stimulants to acid production in the gastrointestinal tract.  Caffeinated and decaffeinated coffee and black tea.  Regular or low-calorie carbonated beverages or energy drinks (caffeine-free carbonated beverages are allowed).   Strong spices, such as black pepper, white pepper, red pepper, cayenne, curry powder, and chili powder.  Peppermint or spearmint.  Chocolate.  High-fat foods, including meats and fried foods. Extra added fats including oils, butter, salad dressings, and nuts. Limit these to less than 8 tsp per day.  Fruits and vegetables if they are not tolerated, such as citrus fruits or tomatoes.  Alcohol.  Any food that seems to aggravate your condition. If you have questions regarding your diet, call your caregiver or a registered dietitian. OTHER  THINGS THAT MAY HELP GERD INCLUDE:   Eating your meals slowly, in a relaxed setting.  Eating 5 to 6 small meals per day instead of 3 large meals.  Eliminating food for a period of time if it causes distress.  Not lying down until 3 hours after eating a meal.  Keeping the head of your bed raised 6 to 9 inches (15 to 23 cm) by using a foam wedge or blocks under the legs of the bed. Lying flat may make symptoms worse.  Being physically active. Weight loss may be helpful in reducing reflux in overweight or obese adults.  Wear loose fitting clothing EXAMPLE MEAL PLAN This meal plan is approximately 2,000 calories based on [email or web address] meal planning guidelines. Breakfast   cup cooked oatmeal.  1 cup strawberries.  1 cup low-fat milk.  1 oz almonds. Snack  1 cup cucumber slices.  6 oz yogurt (made from low-fat or fat-free milk). Lunch  2 slice whole-wheat bread.  2 oz sliced [place].  2 tsp mayonnaise.  1 cup blueberries.  1 cup snap peas. Snack  6 whole-wheat crackers.  1 oz string cheese. Dinner   cup brown rice.  1 cup mixed veggies.  1 tsp olive oil.  3 oz grilled fish. Document Released: 01/11/2005 Document Revised: 04/05/2011 Document Reviewed: 11/27/2010 [facility] Patient Information 2014 [place], [place], [place]

## 2013-02-09 ENCOUNTER — Encounter: Payer: Self-pay | Admitting: *Deleted

## 2013-02-12 ENCOUNTER — Ambulatory Visit: Payer: Commercial Managed Care - PPO | Admitting: Family Medicine

## 2013-02-28 ENCOUNTER — Telehealth: Payer: Self-pay | Admitting: Family Medicine

## 2013-02-28 NOTE — Telephone Encounter (Signed)
lmom for pt to cb

## 2013-02-28 NOTE — Telephone Encounter (Signed)
done

## 2013-02-28 NOTE — Telephone Encounter (Signed)
Mom would like to come in and discuss getting a colonoscopy . Have requested son appt late afternoon, and would like to know if it ok to schedule them both at end of day. Only SD at the end of day . pls advise

## 2013-02-28 NOTE — Telephone Encounter (Signed)
Okay to schedule

## 2013-03-07 ENCOUNTER — Ambulatory Visit (INDEPENDENT_AMBULATORY_CARE_PROVIDER_SITE_OTHER): Payer: Commercial Managed Care - PPO | Admitting: Family Medicine

## 2013-03-07 ENCOUNTER — Encounter: Payer: Self-pay | Admitting: Family Medicine

## 2013-03-07 VITALS — BP 130/80 | HR 78 | Temp 99.0°F | Ht 64.0 in | Wt 130.0 lb

## 2013-03-07 DIAGNOSIS — F411 Generalized anxiety disorder: Secondary | ICD-10-CM

## 2013-03-07 DIAGNOSIS — G4700 Insomnia, unspecified: Secondary | ICD-10-CM

## 2013-03-07 DIAGNOSIS — K219 Gastro-esophageal reflux disease without esophagitis: Secondary | ICD-10-CM

## 2013-03-07 DIAGNOSIS — Z0000 Encounter for general adult medical examination without abnormal findings: Secondary | ICD-10-CM

## 2013-03-07 MED ORDER — SERTRALINE HCL 50 MG PO TABS: 50.0000 mg | ORAL_TABLET | Freq: Every day | ORAL | Status: DC

## 2013-03-07 NOTE — Progress Notes (Signed)
   Subjective:    Patient ID: Bethany Wade, female    DOB: 1960-05-13, 53 y.o.   MRN: [id number]  HPI Here to follow up on several issues. She was here last month for GERD sx, and she took OTC Prilosec for a week. She has also changed her diet to consume less acidic foods, less caffeine, and less alcohol. The GERD went away and now she feels fine. She no longer takes any acid blockers. She asks to be set up for a colonoscopy. Her anxiety and insomnia are well controlled.    Review of Systems  Constitutional: Negative.   Respiratory: Negative.   Cardiovascular: Negative.   Gastrointestinal: Negative.   Neurological: Negative.   Psychiatric/Behavioral: Negative.        Objective:   Physical Exam  Constitutional: She appears well-developed and well-nourished.  Cardiovascular: Normal rate, regular rhythm, normal heart sounds and intact distal pulses.   Pulmonary/Chest: Effort normal and breath sounds normal.  Psychiatric: She has a normal mood and affect. Her behavior is normal. Thought content normal.          Assessment & Plan:  She is doing well. Set up a colonoscopy.

## 2013-03-07 NOTE — Progress Notes (Signed)
Pre visit review using our clinic review tool, if applicable. No additional management support is needed unless otherwise documented below in the visit note. 

## 2013-03-15 ENCOUNTER — Encounter: Payer: Self-pay | Admitting: Internal Medicine

## 2013-03-27 ENCOUNTER — Encounter: Payer: Self-pay | Admitting: Internal Medicine

## 2013-04-04 ENCOUNTER — Encounter: Payer: Commercial Managed Care - PPO | Admitting: Internal Medicine

## 2013-04-17 ENCOUNTER — Ambulatory Visit (AMBULATORY_SURGERY_CENTER): Payer: Self-pay | Admitting: *Deleted

## 2013-04-17 ENCOUNTER — Telehealth: Payer: Self-pay | Admitting: Family Medicine

## 2013-04-17 DIAGNOSIS — Z1211 Encounter for screening for malignant neoplasm of colon: Secondary | ICD-10-CM

## 2013-04-17 MED ORDER — SERTRALINE HCL 50 MG PO TABS: 50.0000 mg | ORAL_TABLET | Freq: Every day | ORAL | Status: DC

## 2013-04-17 MED ORDER — PEG-KCL-NACL-NASULF-NA ASC-C 100 G PO SOLR: ORAL | Status: DC

## 2013-04-17 NOTE — Progress Notes (Signed)
No egg or soy allergy No sleep apnea, no home oxygen use

## 2013-04-17 NOTE — Telephone Encounter (Signed)
[facility] requesting new script for temazepam (RESTORIL) 30 MG capsule

## 2013-04-17 NOTE — Telephone Encounter (Signed)
[facility] is requesting re-fill on sertraline (ZOLOFT) 50 MG tablet

## 2013-04-17 NOTE — Telephone Encounter (Signed)
Rx sent to pharmacy for 1 year.

## 2013-04-19 NOTE — Telephone Encounter (Signed)
Too early, this is not due until 07-13-13. When wh get to around 2 weeks prior to this date, have her contact us and we will send a new one to [facility]

## 2013-04-19 NOTE — Telephone Encounter (Signed)
Pt would like to change from local pharmacy to a mail order. Can we send in a new script? Per Dr. Sarajane Jews, okay to fill for a 90 day supply with 1 refill.

## 2013-04-20 MED ORDER — TEMAZEPAM 30 MG PO CAPS: 30.0000 mg | ORAL_CAPSULE | Freq: Every evening | ORAL | Status: DC | PRN

## 2013-04-20 NOTE — Telephone Encounter (Signed)
Rx printed and faxed to [facility].

## 2013-04-26 ENCOUNTER — Encounter: Payer: Self-pay | Admitting: Internal Medicine

## 2013-04-30 ENCOUNTER — Ambulatory Visit (AMBULATORY_SURGERY_CENTER): Payer: 59 | Admitting: Internal Medicine

## 2013-04-30 ENCOUNTER — Encounter: Payer: Self-pay | Admitting: Internal Medicine

## 2013-04-30 VITALS — BP 133/92 | HR 51 | Temp 97.9°F | Resp 17 | Ht 64.0 in | Wt 130.0 lb

## 2013-04-30 DIAGNOSIS — D126 Benign neoplasm of colon, unspecified: Secondary | ICD-10-CM

## 2013-04-30 DIAGNOSIS — Z1211 Encounter for screening for malignant neoplasm of colon: Secondary | ICD-10-CM

## 2013-04-30 MED ORDER — SODIUM CHLORIDE 0.9 % IV SOLN: 500.0000 mL | INTRAVENOUS | Status: DC

## 2013-04-30 NOTE — Patient Instructions (Signed)
YOU HAD AN ENDOSCOPIC PROCEDURE TODAY AT THE [facility]: Refer to the procedure report that was given to you for any specific questions about what was found during the examination.  If the procedure report does not answer your questions, please call your gastroenterologist to clarify.  If you requested that your care partner not be given the details of your procedure findings, then the procedure report has been included in a sealed envelope for you to review at your convenience later.  YOU SHOULD EXPECT: Some feelings of bloating in the abdomen. Passage of more gas than usual.  Walking can help get rid of the air that was put into your GI tract during the procedure and reduce the bloating. If you had a lower endoscopy (such as a colonoscopy or flexible sigmoidoscopy) you may notice spotting of blood in your stool or on the toilet paper. If you underwent a bowel prep for your procedure, then you may not have a normal bowel movement for a few days.  DIET: Your first meal following the procedure should be a light meal and then it is ok to progress to your normal diet.  A half-sandwich or bowl of soup is an example of a good first meal.  Heavy or fried foods are harder to digest and may make you feel nauseous or bloated.  Likewise meals heavy in dairy and vegetables can cause extra gas to form and this can also increase the bloating.  Drink plenty of fluids but you should avoid alcoholic beverages for 24 hours.  ACTIVITY: Your care partner should take you home directly after the procedure.  You should plan to take it easy, moving slowly for the rest of the day.  You can resume normal activity the day after the procedure however you should NOT DRIVE or use heavy machinery for 24 hours (because of the sedation medicines used during the test).    SYMPTOMS TO REPORT IMMEDIATELY: A gastroenterologist can be reached at any hour.  During normal business hours, 8:30 AM to 5:00 PM Monday through Friday,  call [phone].  After hours and on weekends, please call the GI answering service at [phone] who will take a message and have the physician on call contact you.   Following lower endoscopy (colonoscopy or flexible sigmoidoscopy):  Excessive amounts of blood in the stool  Significant tenderness or worsening of abdominal pains  Swelling of the abdomen that is new, acute  Fever of 100F or higher    FOLLOW UP: If any biopsies were taken you will be contacted by phone or by letter within the next 1-3 weeks.  Call your gastroenterologist if you have not heard about the biopsies in 3 weeks.  Our staff will call the home number listed on your records the next business day following your procedure to check on you and address any questions or concerns that you may have at that time regarding the information given to you following your procedure. This is a courtesy call and so if there is no answer at the home number and we have not heard from you through the emergency physician on call, we will assume that you have returned to your regular daily activities without incident.  SIGNATURES/CONFIDENTIALITY: You and/or your care partner have signed paperwork which will be entered into your electronic medical record.  These signatures attest to the fact that that the information above on your After Visit Summary has been reviewed and is understood.  Full responsibility of the confidentiality   of this discharge information lies with you and/or your care-partner.     

## 2013-04-30 NOTE — Progress Notes (Signed)
Called to room to assist during endoscopic procedure.  Patient ID and intended procedure confirmed with present staff. Received instructions for my participation in the procedure from the performing physician.  

## 2013-04-30 NOTE — Progress Notes (Signed)
Procedure ends, to recovery, report given and VSS. 

## 2013-04-30 NOTE — Op Note (Signed)
[facility]  [address] [place], [id number]   COLONOSCOPY PROCEDURE REPORT  PATIENT: Bethany Wade, Bethany Wade  MR#: [id number] BIRTHDATE: January 25, 1961 , 52  yrs. old GENDER: Female ENDOSCOPIST: Eustace Quail, MD REFERRED GL:OVFIEPP Sarajane Jews, M.D. PROCEDURE DATE:  04/30/2013 PROCEDURE:   Colonoscopy with snare polypectomy x 1 First Screening Colonoscopy - Avg.  risk and is 50 yrs.  old or older Yes.  Prior Negative Screening - Now for repeat screening. N/A  History of Adenoma - Now for follow-up colonoscopy & has been > or = to 3 yrs.  N/A  Polyps Removed Today? Yes. ASA CLASS:   Class I INDICATIONS:average risk screening. MEDICATIONS: MAC sedation, administered by CRNA and propofol (Diprivan) 200mg  IV  DESCRIPTION OF PROCEDURE:   After the risks benefits and alternatives of the procedure were thoroughly explained, informed consent was obtained.  A digital rectal exam revealed no abnormalities of the rectum.   The [id number]  endoscope was introduced through the anus and advanced to the cecum, which was identified by both the appendix and ileocecal valve. No adverse events experienced.   The quality of the prep was excellent, using MoviPrep  The instrument was then slowly withdrawn as the colon was fully examined.      COLON FINDINGS: A sessile polyp with mucus cap measuring 8 mm in size was found at the cecum.  A polypectomy was performed with a cold snare.  The resection was complete and the polyp tissue was completely retrieved.   The colon was otherwise normal.  There was no diverticulosis, inflammation, other polyps or cancers unless previously stated.  Retroflexed views revealed internal hemorrhoids. The time to cecum=4 minutes 22 seconds.  Withdrawal time=11 minutes 05 seconds.  The scope was withdrawn and the procedure completed. COMPLICATIONS: There were no complications.  ENDOSCOPIC IMPRESSION: 1.   Sessile polyp measuring 8 mm in size was found  at the cecum; polypectomy was performed with a cold snare 2.   The colon was otherwise normal  RECOMMENDATIONS: 1. Follow up colonoscopy in 5 years   eSigned:  Eustace Quail, MD 04/30/2013 1:52 PM   cc: Laurey Morale, MD and The Patient

## 2013-05-01 ENCOUNTER — Telehealth: Payer: Self-pay

## 2013-05-01 NOTE — Telephone Encounter (Signed)
  Follow up Call-  Call back number 04/30/2013  Post procedure Call Back phone  # [phone]  Permission to leave phone message Yes     Patient questions:  Do you have a fever, pain , or abdominal swelling? no Pain Score  0 *  Have you tolerated food without any problems? yes  Have you been able to return to your normal activities? yes  Do you have any questions about your discharge instructions: Diet   no Medications  no Follow up visit  no  Do you have questions or concerns about your Care? no  Actions: * If pain score is 4 or above: No action needed, pain <4.  No problems per the pt.  She thanked [place] for taking very good care of her. Maw

## 2013-05-07 ENCOUNTER — Encounter: Payer: Self-pay | Admitting: Internal Medicine

## 2013-05-10 HISTORY — PX: COLONOSCOPY: SHX174

## 2013-10-31 ENCOUNTER — Telehealth: Payer: Self-pay | Admitting: Family Medicine

## 2013-10-31 MED ORDER — TEMAZEPAM 30 MG PO CAPS: 30.0000 mg | ORAL_CAPSULE | Freq: Every evening | ORAL | Status: DC | PRN

## 2013-10-31 NOTE — Telephone Encounter (Signed)
[facility] is requesting re-fill on temazepam (RESTORIL) 30 MG capsule

## 2013-10-31 NOTE — Telephone Encounter (Signed)
I faxed script to below number.

## 2013-10-31 NOTE — Telephone Encounter (Signed)
Ready to be faxed.

## 2014-02-08 ENCOUNTER — Telehealth: Payer: Self-pay | Admitting: Family Medicine

## 2014-02-08 MED ORDER — TEMAZEPAM 30 MG PO CAPS: 30.0000 mg | ORAL_CAPSULE | Freq: Every evening | ORAL | Status: DC | PRN

## 2014-02-08 NOTE — Telephone Encounter (Signed)
Call in #30 to local pharmacy. I printed a rx for mail away

## 2014-02-08 NOTE — Telephone Encounter (Signed)
Pt request refill temazepam (RESTORIL) [facility]  Then [facility]

## 2014-02-11 NOTE — Telephone Encounter (Signed)
I called in a [facility] supply [facility] and faxed script for mail order.

## 2014-02-12 ENCOUNTER — Other Ambulatory Visit: Payer: Self-pay | Admitting: Obstetrics and Gynecology

## 2014-02-13 ENCOUNTER — Encounter: Payer: Self-pay | Admitting: Family Medicine

## 2014-02-13 ENCOUNTER — Ambulatory Visit (INDEPENDENT_AMBULATORY_CARE_PROVIDER_SITE_OTHER): Payer: 59 | Admitting: Family Medicine

## 2014-02-13 ENCOUNTER — Ambulatory Visit: Payer: Self-pay | Admitting: Family Medicine

## 2014-02-13 VITALS — BP 141/89 | HR 79 | Temp 98.3°F | Ht 64.0 in | Wt 132.0 lb

## 2014-02-13 DIAGNOSIS — Z23 Encounter for immunization: Secondary | ICD-10-CM

## 2014-02-13 DIAGNOSIS — I10 Essential (primary) hypertension: Secondary | ICD-10-CM

## 2014-02-13 LAB — CYTOLOGY - PAP

## 2014-02-13 MED ORDER — LISINOPRIL 10 MG PO TABS: 10.0000 mg | ORAL_TABLET | Freq: Every day | ORAL | Status: DC

## 2014-02-13 MED ORDER — TEMAZEPAM 30 MG PO CAPS: 30.0000 mg | ORAL_CAPSULE | Freq: Every evening | ORAL | Status: DC | PRN

## 2014-02-13 NOTE — Progress Notes (Signed)
   Subjective:    Patient ID: Bethany Wade, female    DOB: 04-10-1960, 54 y.o.   MRN: [id number]  HPI Here with concerns about her BP. She has a strong family hx of HTN and heart disease. She recently saw her GYN for a check up and her BP was in the 150s over 90s. She has checked it several times since then at her pharmacy and it is consistently in the 939Q or 300P systolic. She feels fine. She already eats a healthy diet and she exercises regularly.    Review of Systems  Constitutional: Negative.   Respiratory: Negative.   Cardiovascular: Negative.   Neurological: Negative.        Objective:   Physical Exam  Constitutional: She is oriented to person, place, and time. She appears well-developed and well-nourished.  Cardiovascular: Normal rate, regular rhythm, normal heart sounds and intact distal pulses.   Pulmonary/Chest: Effort normal and breath sounds normal.  Neurological: She is alert and oriented to person, place, and time.          Assessment & Plan:  She does have HTN and we will start treating this with Lisinopril 10 mg daily. She will return to check this in one month, and we will do a complete physical with labs at the same time.

## 2014-02-13 NOTE — Progress Notes (Signed)
Pre visit review using our clinic review tool, if applicable. No additional management support is needed unless otherwise documented below in the visit note. 

## 2014-02-13 NOTE — Addendum Note (Signed)
Addended by: Aggie Hacker A on: 02/13/2014 12:18 PM   Modules accepted: Orders

## 2014-03-07 ENCOUNTER — Other Ambulatory Visit (INDEPENDENT_AMBULATORY_CARE_PROVIDER_SITE_OTHER): Payer: 59

## 2014-03-07 DIAGNOSIS — Z0000 Encounter for general adult medical examination without abnormal findings: Secondary | ICD-10-CM

## 2014-03-07 LAB — CBC WITH DIFFERENTIAL/PLATELET
Basophils Absolute: 0 10*3/uL (ref 0.0–0.1)
Basophils Relative: 0.5 % (ref 0.0–3.0)
Eosinophils Absolute: 0.1 10*3/uL (ref 0.0–0.7)
Eosinophils Relative: 3.3 % (ref 0.0–5.0)
HCT: 40.3 % (ref 36.0–46.0)
Hemoglobin: 13.4 g/dL (ref 12.0–15.0)
Lymphocytes Relative: 45.5 % (ref 12.0–46.0)
Lymphs Abs: 2 10*3/uL (ref 0.7–4.0)
MCHC: 33.4 g/dL (ref 30.0–36.0)
MCV: 81.8 fl (ref 78.0–100.0)
Monocytes Absolute: 0.2 10*3/uL (ref 0.1–1.0)
Monocytes Relative: 5.4 % (ref 3.0–12.0)
Neutro Abs: 2 10*3/uL (ref 1.4–7.7)
Neutrophils Relative %: 45.3 % (ref 43.0–77.0)
Platelets: 213 10*3/uL (ref 150.0–400.0)
RBC: 4.93 Mil/uL (ref 3.87–5.11)
RDW: 15.7 % — ABNORMAL HIGH (ref 11.5–15.5)
WBC: 4.3 10*3/uL (ref 4.0–10.5)

## 2014-03-07 LAB — COMPREHENSIVE METABOLIC PANEL
ALT: 20 U/L (ref 0–35)
AST: 28 U/L (ref 0–37)
Albumin: 4.4 g/dL (ref 3.5–5.2)
Alkaline Phosphatase: 67 U/L (ref 39–117)
BUN: 16 mg/dL (ref 6–23)
CALCIUM: 9.4 mg/dL (ref 8.4–10.5)
CO2: 31 meq/L (ref 19–32)
Chloride: 102 mEq/L (ref 96–112)
Creatinine, Ser: 0.89 mg/dL (ref 0.40–1.20)
GFR: 70.74 mL/min (ref >60.00)
Glucose, Bld: 83 mg/dL (ref 70–99)
Potassium: 4.7 mEq/L (ref 3.5–5.1)
SODIUM: 138 meq/L (ref 135–145)
TOTAL PROTEIN: 7.5 g/dL (ref 6.0–8.3)
Total Bilirubin: 0.5 mg/dL (ref 0.2–1.2)

## 2014-03-07 LAB — POCT URINALYSIS DIPSTICK
Bilirubin, UA: NEGATIVE
Glucose, UA: NEGATIVE
Ketones, UA: NEGATIVE
Leukocytes, UA: NEGATIVE
NITRITE UA: NEGATIVE
PH UA: 7.5
Protein, UA: NEGATIVE
RBC UA: NEGATIVE
Spec Grav, UA: 1.015
UROBILINOGEN UA: 0.2

## 2014-03-07 LAB — HIGH SENSITIVITY CRP: CRP HIGH SENSITIVITY: 0.54 mg/L (ref 0.000–5.000)

## 2014-03-07 LAB — LIPID PANEL
CHOL/HDL RATIO: 3
Cholesterol: 220 mg/dL — ABNORMAL HIGH (ref 0–200)
HDL: 73.7 mg/dL (ref >39.00)
LDL CALC: 123 mg/dL — AB (ref 0–99)
NonHDL: 146.3
TRIGLYCERIDES: 118 mg/dL (ref 0.0–149.0)
VLDL: 23.6 mg/dL (ref 0.0–40.0)

## 2014-03-07 LAB — TSH: TSH: 2.39 u[IU]/mL (ref 0.35–4.50)

## 2014-03-18 ENCOUNTER — Ambulatory Visit (INDEPENDENT_AMBULATORY_CARE_PROVIDER_SITE_OTHER): Payer: 59 | Admitting: Family Medicine

## 2014-03-18 ENCOUNTER — Encounter: Payer: Self-pay | Admitting: Family Medicine

## 2014-03-18 VITALS — BP 118/75 | HR 69 | Temp 98.3°F | Ht 64.0 in | Wt 132.0 lb

## 2014-03-18 DIAGNOSIS — Z0000 Encounter for general adult medical examination without abnormal findings: Secondary | ICD-10-CM

## 2014-03-18 NOTE — Progress Notes (Signed)
   Subjective:    Patient ID: Bethany Wade, female    DOB: 1960-04-03, 54 y.o.   MRN: [id number]  HPI 54 yr old female for a cpx. She feels fine.    Review of Systems  Constitutional: Negative.   HENT: Negative.   Eyes: Negative.   Respiratory: Negative.   Cardiovascular: Negative.   Gastrointestinal: Negative.   Genitourinary: Negative for dysuria, urgency, frequency, hematuria, flank pain, decreased urine volume, enuresis, difficulty urinating, pelvic pain and dyspareunia.  Musculoskeletal: Negative.   Skin: Negative.   Neurological: Negative.   Psychiatric/Behavioral: Negative.        Objective:   Physical Exam  Constitutional: She is oriented to person, place, and time. She appears well-developed and well-nourished. No distress.  HENT:  Head: Normocephalic and atraumatic.  Right Ear: External ear normal.  Left Ear: External ear normal.  Nose: Nose normal.  Mouth/Throat: Oropharynx is clear and moist. No oropharyngeal exudate.  Eyes: Conjunctivae and EOM are normal. Pupils are equal, round, and reactive to light. No scleral icterus.  Neck: Normal range of motion. Neck supple. No JVD present. No thyromegaly present.  Cardiovascular: Normal rate, regular rhythm, normal heart sounds and intact distal pulses.  Exam reveals no gallop and no friction rub.   No murmur heard. EKG normal   Pulmonary/Chest: Effort normal and breath sounds normal. No respiratory distress. She has no wheezes. She has no rales. She exhibits no tenderness.  Abdominal: Soft. Bowel sounds are normal. She exhibits no distension and no mass. There is no tenderness. There is no rebound and no guarding.  Musculoskeletal: Normal range of motion. She exhibits no edema or tenderness.  Lymphadenopathy:    She has no cervical adenopathy.  Neurological: She is alert and oriented to person, place, and time. She has normal reflexes. No cranial nerve deficit. She exhibits normal muscle tone. Coordination  normal.  Skin: Skin is warm and dry. No rash noted. No erythema.  Psychiatric: She has a normal mood and affect. Her behavior is normal. Judgment and thought content normal.          Assessment & Plan:  Well exam.

## 2014-03-18 NOTE — Progress Notes (Signed)
Pre visit review using our clinic review tool, if applicable. No additional management support is needed unless otherwise documented below in the visit note. 

## 2014-04-03 ENCOUNTER — Other Ambulatory Visit: Payer: Self-pay | Admitting: Family Medicine

## 2014-06-10 ENCOUNTER — Other Ambulatory Visit: Payer: Self-pay | Admitting: Family Medicine

## 2014-07-04 ENCOUNTER — Telehealth: Payer: Self-pay

## 2014-07-04 NOTE — Telephone Encounter (Signed)
Left message for pt to return call (concerning overdue mammogram)

## 2014-07-17 NOTE — Telephone Encounter (Signed)
Pt did have [facility] in feb.  Normal results

## 2014-09-03 ENCOUNTER — Other Ambulatory Visit: Payer: Self-pay | Admitting: Family Medicine

## 2014-09-03 NOTE — Telephone Encounter (Signed)
Ok to refill 

## 2014-09-03 NOTE — Telephone Encounter (Signed)
Pt was last here in office on 03/18/14 and due for a refill.

## 2014-09-04 MED ORDER — TEMAZEPAM 30 MG PO CAPS: 30.0000 mg | ORAL_CAPSULE | Freq: Every evening | ORAL | Status: DC | PRN

## 2014-09-04 NOTE — Telephone Encounter (Signed)
Ok to refill 

## 2014-09-09 ENCOUNTER — Telehealth: Payer: Self-pay | Admitting: Family Medicine

## 2014-09-09 MED ORDER — TEMAZEPAM 30 MG PO CAPS: 30.0000 mg | ORAL_CAPSULE | Freq: Every evening | ORAL | Status: DC | PRN

## 2014-09-09 NOTE — Telephone Encounter (Signed)
done

## 2014-10-06 ENCOUNTER — Other Ambulatory Visit: Payer: Self-pay | Admitting: Family Medicine

## 2015-02-24 ENCOUNTER — Telehealth: Payer: Self-pay

## 2015-02-24 ENCOUNTER — Other Ambulatory Visit: Payer: Self-pay

## 2015-02-24 MED ORDER — TEMAZEPAM 30 MG PO CAPS: 30.0000 mg | ORAL_CAPSULE | Freq: Every evening | ORAL | Status: DC | PRN

## 2015-02-24 NOTE — Telephone Encounter (Signed)
Call in #30 with 5 rf 

## 2015-02-24 NOTE — Telephone Encounter (Signed)
Rx was fax in

## 2015-02-24 NOTE — Addendum Note (Signed)
Addended by: Ailene Rud E on: 02/24/2015 03:11 PM   Modules accepted: Orders

## 2015-02-24 NOTE — Telephone Encounter (Signed)
Rx was call in  

## 2015-02-24 NOTE — Telephone Encounter (Signed)
Pt request temazepam (RESTORIL) 30 MG capsule Pt last visit 03/18/14 Pt last refill 09/09/14 #30 no refills

## 2015-02-24 NOTE — Telephone Encounter (Signed)
Pt requesting  temazepam (RESTORIL) 30 MG capsule Pt last visit 03/18/14 Pt last refill 09/09/14 #30 no refills

## 2015-03-29 ENCOUNTER — Other Ambulatory Visit: Payer: Self-pay | Admitting: Family Medicine

## 2015-05-12 ENCOUNTER — Other Ambulatory Visit: Payer: Self-pay | Admitting: Family Medicine

## 2015-05-12 ENCOUNTER — Telehealth: Payer: Self-pay | Admitting: Family Medicine

## 2015-05-12 NOTE — Telephone Encounter (Signed)
Looks like pt needs a office visit?  

## 2015-05-12 NOTE — Telephone Encounter (Signed)
Pt need new Rx for temazepam 30 mg.  Pharm:  [facility]   Pt only need to have 10 days until she receive it by [facility] by mail.  Pt is out of medicine as of 4/16.

## 2015-05-12 NOTE — Telephone Encounter (Signed)
Okay to call in #10 locally

## 2015-05-12 NOTE — Telephone Encounter (Signed)
Can we call in a 10 day supply?

## 2015-05-13 ENCOUNTER — Other Ambulatory Visit: Payer: Self-pay | Admitting: Family Medicine

## 2015-05-13 MED ORDER — TEMAZEPAM 30 MG PO CAPS: 30.0000 mg | ORAL_CAPSULE | Freq: Every evening | ORAL | Status: DC | PRN

## 2015-05-13 NOTE — Telephone Encounter (Signed)
I called in script 

## 2015-07-21 ENCOUNTER — Telehealth: Payer: Self-pay | Admitting: Family Medicine

## 2015-07-21 NOTE — Telephone Encounter (Signed)
Call in #90 with one rf 

## 2015-07-21 NOTE — Telephone Encounter (Signed)
Refill request for Temazepam and a 90 day supply to [facility].

## 2015-07-22 MED ORDER — TEMAZEPAM 30 MG PO CAPS: 30.0000 mg | ORAL_CAPSULE | Freq: Every evening | ORAL | Status: DC | PRN

## 2015-07-22 NOTE — Telephone Encounter (Signed)
Script was printed and faxed to [facility]. 

## 2015-10-07 ENCOUNTER — Other Ambulatory Visit: Payer: Self-pay | Admitting: Family Medicine

## 2015-12-18 ENCOUNTER — Other Ambulatory Visit: Payer: Self-pay | Admitting: Family Medicine

## 2016-01-21 ENCOUNTER — Other Ambulatory Visit: Payer: Self-pay | Admitting: Family Medicine

## 2016-01-29 ENCOUNTER — Other Ambulatory Visit: Payer: Self-pay | Admitting: Family Medicine

## 2016-02-16 ENCOUNTER — Telehealth: Payer: Self-pay | Admitting: Family Medicine

## 2016-02-16 MED ORDER — TEMAZEPAM 30 MG PO CAPS: 30.0000 mg | ORAL_CAPSULE | Freq: Every evening | ORAL | 0 refills | Status: DC | PRN

## 2016-02-16 NOTE — Telephone Encounter (Signed)
done

## 2016-03-04 ENCOUNTER — Other Ambulatory Visit: Payer: Self-pay | Admitting: Family Medicine

## 2016-03-05 ENCOUNTER — Other Ambulatory Visit: Payer: Self-pay | Admitting: Family Medicine

## 2016-03-16 ENCOUNTER — Other Ambulatory Visit (INDEPENDENT_AMBULATORY_CARE_PROVIDER_SITE_OTHER): Payer: 59

## 2016-03-16 DIAGNOSIS — Z0000 Encounter for general adult medical examination without abnormal findings: Secondary | ICD-10-CM

## 2016-03-16 LAB — TSH: TSH: 1.58 u[IU]/mL (ref 0.35–4.50)

## 2016-03-16 LAB — BASIC METABOLIC PANEL
BUN: 22 mg/dL (ref 6–23)
CHLORIDE: 103 meq/L (ref 96–112)
CO2: 30 meq/L (ref 19–32)
CREATININE: 0.77 mg/dL (ref 0.40–1.20)
Calcium: 8.6 mg/dL (ref 8.4–10.5)
GFR: 82.96 mL/min (ref >60.00)
Glucose, Bld: 85 mg/dL (ref 70–99)
Potassium: 4.2 mEq/L (ref 3.5–5.1)
Sodium: 137 mEq/L (ref 135–145)

## 2016-03-16 LAB — HEPATIC FUNCTION PANEL
ALT: 34 U/L (ref 0–35)
AST: 31 U/L (ref 0–37)
Albumin: 4.2 g/dL (ref 3.5–5.2)
Alkaline Phosphatase: 66 U/L (ref 39–117)
BILIRUBIN TOTAL: 0.3 mg/dL (ref 0.2–1.2)
Bilirubin, Direct: 0.1 mg/dL (ref 0.0–0.3)
Total Protein: 6.6 g/dL (ref 6.0–8.3)

## 2016-03-16 LAB — CBC WITH DIFFERENTIAL/PLATELET
BASOS PCT: 0.4 % (ref 0.0–3.0)
Basophils Absolute: 0 10*3/uL (ref 0.0–0.1)
EOS ABS: 0.1 10*3/uL (ref 0.0–0.7)
Eosinophils Relative: 4.2 % (ref 0.0–5.0)
HCT: 36.9 % (ref 36.0–46.0)
Hemoglobin: 12.4 g/dL (ref 12.0–15.0)
Lymphocytes Relative: 36.8 % (ref 12.0–46.0)
Lymphs Abs: 1.2 10*3/uL (ref 0.7–4.0)
MCHC: 33.5 g/dL (ref 30.0–36.0)
MCV: 86.2 fl (ref 78.0–100.0)
MONO ABS: 0.2 10*3/uL (ref 0.1–1.0)
Monocytes Relative: 5.3 % (ref 3.0–12.0)
NEUTROS ABS: 1.8 10*3/uL (ref 1.4–7.7)
NEUTROS PCT: 53.3 % (ref 43.0–77.0)
PLATELETS: 177 10*3/uL (ref 150.0–400.0)
RBC: 4.28 Mil/uL (ref 3.87–5.11)
RDW: 13.7 % (ref 11.5–15.5)
WBC: 3.3 10*3/uL — AB (ref 4.0–10.5)

## 2016-03-16 LAB — POC URINALSYSI DIPSTICK (AUTOMATED)
Bilirubin, UA: NEGATIVE
Blood, UA: NEGATIVE
Glucose, UA: NEGATIVE
Ketones, UA: NEGATIVE
LEUKOCYTES UA: NEGATIVE
Nitrite, UA: NEGATIVE
PROTEIN UA: NEGATIVE
SPEC GRAV UA: 1.015
UROBILINOGEN UA: 0.2
pH, UA: 7.5

## 2016-03-16 LAB — LIPID PANEL
CHOLESTEROL: 167 mg/dL (ref 0–200)
HDL: 57.9 mg/dL (ref >39.00)
LDL Cholesterol: 94 mg/dL (ref 0–99)
NonHDL: 109.55
Total CHOL/HDL Ratio: 3
Triglycerides: 76 mg/dL (ref 0.0–149.0)
VLDL: 15.2 mg/dL (ref 0.0–40.0)

## 2016-03-23 ENCOUNTER — Ambulatory Visit (INDEPENDENT_AMBULATORY_CARE_PROVIDER_SITE_OTHER): Payer: 59 | Admitting: Family Medicine

## 2016-03-23 ENCOUNTER — Encounter: Payer: Self-pay | Admitting: Family Medicine

## 2016-03-23 VITALS — BP 109/73 | HR 70 | Temp 98.4°F | Ht 64.0 in | Wt 135.0 lb

## 2016-03-23 DIAGNOSIS — Z0000 Encounter for general adult medical examination without abnormal findings: Secondary | ICD-10-CM

## 2016-03-23 MED ORDER — LISINOPRIL 10 MG PO TABS: 10.0000 mg | ORAL_TABLET | Freq: Every day | ORAL | 3 refills | Status: DC

## 2016-03-23 MED ORDER — TEMAZEPAM 30 MG PO CAPS: 30.0000 mg | ORAL_CAPSULE | Freq: Every evening | ORAL | 1 refills | Status: DC | PRN

## 2016-03-23 NOTE — Progress Notes (Signed)
   Subjective:    Patient ID: Bethany Wade, female    DOB: 07/20/1960, 56 y.o.   MRN: [id number]  HPI 56 yr old female for a well exam. She feels great. She still plays a lot of tennis.    Review of Systems  Constitutional: Negative.   HENT: Negative.   Eyes: Negative.   Respiratory: Negative.   Cardiovascular: Negative.   Gastrointestinal: Negative.   Genitourinary: Negative for decreased urine volume, difficulty urinating, dyspareunia, dysuria, enuresis, flank pain, frequency, hematuria, pelvic pain and urgency.  Musculoskeletal: Negative.   Skin: Negative.   Neurological: Negative.   Psychiatric/Behavioral: Negative.        Objective:   Physical Exam  Constitutional: She is oriented to person, place, and time. She appears well-developed and well-nourished. No distress.  HENT:  Head: Normocephalic and atraumatic.  Right Ear: External ear normal.  Left Ear: External ear normal.  Nose: Nose normal.  Mouth/Throat: Oropharynx is clear and moist. No oropharyngeal exudate.  Eyes: Conjunctivae and EOM are normal. Pupils are equal, round, and reactive to light. No scleral icterus.  Neck: Normal range of motion. Neck supple. No JVD present. No thyromegaly present.  Cardiovascular: Normal rate, regular rhythm, normal heart sounds and intact distal pulses.  Exam reveals no gallop and no friction rub.   No murmur heard. Pulmonary/Chest: Effort normal and breath sounds normal. No respiratory distress. She has no wheezes. She has no rales. She exhibits no tenderness.  Abdominal: Soft. Bowel sounds are normal. She exhibits no distension and no mass. There is no tenderness. There is no rebound and no guarding.  Musculoskeletal: Normal range of motion. She exhibits no edema or tenderness.  Lymphadenopathy:    She has no cervical adenopathy.  Neurological: She is alert and oriented to person, place, and time. She has normal reflexes. No cranial nerve deficit. She exhibits normal  muscle tone. Coordination normal.  Skin: Skin is warm and dry. No rash noted. No erythema.  Psychiatric: She has a normal mood and affect. Her behavior is normal. Judgment and thought content normal.          Assessment & Plan:  Well exam. We discussed diet and exercise. Alysia Penna, MD **Coming March 12th**  [name]!!!  Same Day Appointments for Acute Care: Sprains, Injuries, cuts, abrasions Colds, flu, sore throats, cough, upset stomachs Fever, ear pain Sinus and eye infections Animal/insect bites  3 Easy Ways to Schedule: Walk-In Scheduling Call in scheduling Mychart Sign-up: [email or web address]

## 2016-03-23 NOTE — Progress Notes (Signed)
Pre visit review using our clinic review tool, if applicable. No additional management support is needed unless otherwise documented below in the visit note. 

## 2016-04-26 ENCOUNTER — Other Ambulatory Visit: Payer: Self-pay | Admitting: *Deleted

## 2016-04-26 MED ORDER — TEMAZEPAM 30 MG PO CAPS: 30.0000 mg | ORAL_CAPSULE | Freq: Every evening | ORAL | 1 refills | Status: DC | PRN

## 2016-04-26 NOTE — Telephone Encounter (Signed)
Patient came in to office reporting that she lost her prescription given to her at last office visit on 03/23/16 for temazepam and needs a new prescription; Dr. Sarajane Jews notified who requests that refill be called in to pharmacy; refill called in to [facility] on [address] advised patient, understanding voiced.

## 2016-07-02 ENCOUNTER — Emergency Department (HOSPITAL_COMMUNITY): Payer: 59

## 2016-07-02 ENCOUNTER — Encounter (HOSPITAL_COMMUNITY): Payer: Self-pay | Admitting: Emergency Medicine

## 2016-07-02 ENCOUNTER — Emergency Department (HOSPITAL_COMMUNITY)
Admission: EM | Admit: 2016-07-02 | Discharge: 2016-07-02 | Disposition: A | Discharge status: Home / Self Care | Payer: 59 | Attending: Emergency Medicine | Admitting: Emergency Medicine

## 2016-07-02 DIAGNOSIS — Y999 Unspecified external cause status: Secondary | ICD-10-CM | POA: Insufficient documentation

## 2016-07-02 DIAGNOSIS — R51 Headache: Secondary | ICD-10-CM | POA: Diagnosis not present

## 2016-07-02 DIAGNOSIS — S0101XA Laceration without foreign body of scalp, initial encounter: Secondary | ICD-10-CM

## 2016-07-02 DIAGNOSIS — Z79899 Other long term (current) drug therapy: Secondary | ICD-10-CM | POA: Diagnosis not present

## 2016-07-02 DIAGNOSIS — Y939 Activity, unspecified: Secondary | ICD-10-CM | POA: Insufficient documentation

## 2016-07-02 DIAGNOSIS — W01198A Fall on same level from slipping, tripping and stumbling with subsequent striking against other object, initial encounter: Secondary | ICD-10-CM | POA: Diagnosis not present

## 2016-07-02 DIAGNOSIS — S0990XA Unspecified injury of head, initial encounter: Secondary | ICD-10-CM

## 2016-07-02 DIAGNOSIS — Y92008 Other place in unspecified non-institutional (private) residence as the place of occurrence of the external cause: Secondary | ICD-10-CM | POA: Diagnosis not present

## 2016-07-02 DIAGNOSIS — Z7982 Long term (current) use of aspirin: Secondary | ICD-10-CM | POA: Diagnosis not present

## 2016-07-02 DIAGNOSIS — I10 Essential (primary) hypertension: Secondary | ICD-10-CM | POA: Insufficient documentation

## 2016-07-02 DIAGNOSIS — S0181XA Laceration without foreign body of other part of head, initial encounter: Secondary | ICD-10-CM

## 2016-07-02 DIAGNOSIS — S161XXA Strain of muscle, fascia and tendon at neck level, initial encounter: Secondary | ICD-10-CM

## 2016-07-02 HISTORY — DX: Essential (primary) hypertension: I10

## 2016-07-02 MED ORDER — LIDOCAINE-EPINEPHRINE (PF) 2 %-1:200000 IJ SOLN
20.0000 mL | Freq: Once | INTRAMUSCULAR | Status: AC
  Administered 2016-07-02: 20 mL
  Filled 2016-07-02: qty 20

## 2016-07-02 MED ORDER — TETANUS-DIPHTH-ACELL PERTUSSIS 5-2.5-18.5 LF-MCG/0.5 IM SUSP
0.5000 mL | Freq: Once | INTRAMUSCULAR | Status: AC
  Administered 2016-07-02: 0.5 mL via INTRAMUSCULAR
  Filled 2016-07-02: qty 0.5

## 2016-07-02 NOTE — ED Provider Notes (Signed)
[facility] DEPT Provider Note   CSN: [id number] Arrival date & time: 07/02/16  0119  By signing my name below, I, Collene Leyden, attest that this documentation has been prepared under the direction and in the presence of Veryl Speak, MD. Electronically Signed: Collene Leyden, Scribe. 07/02/16. 2:05 AM.  History   Chief Complaint Chief Complaint  Patient presents with  . Facial Laceration    HPI Comments: Bethany Wade is a 56 y.o. female with no pertinent past medical history, who presents to the Emergency Department complaining of sudden-onset facial laceration s/p mechanical fall, which occurred earlier tonight. Patient states she went to the bathroom and tripped and fell, she does not remember what she hit her head on. Patient does take a daily baby aspirin. Patient reports an associated headache and neck pain. No modifying factors indicated. Last tetanus shot is unknown. Patient denies any blood thinner use, fever, chills, nausea, vomiting, LOC, or any additional symptoms.   HPI  Past Medical History:  Diagnosis Date  . Anxiety   . GERD (gastroesophageal reflux disease)   . Hypertension   . Insomnia     Patient Active Problem List   Diagnosis Date Noted  . HTN (hypertension) 02/13/2014  . GERD (gastroesophageal reflux disease) 03/07/2013  . Other and unspecified hyperlipidemia 07/25/2012  . Left shoulder pain 12/14/2010  . Left leg pain 12/01/2010  . MIGRAINE HEADACHE 06/06/2009  . TALIPES CAVUS 04/28/2009  . WRIST PAIN 03/25/2009  . GANGLION CYST, WRIST, RIGHT 03/25/2009  . TOE PAIN 03/25/2009  . BACTERIURIA 10/08/2008  . LIVER FUNCTION TESTS, ABNORMAL, HX OF 10/08/2008  . INSOMNIA 10/16/2007  . ACUTE SINUSITIS, UNSPECIFIED 02/21/2007  . ANXIETY 08/16/2006  . DIZZINESS 08/16/2006  . POSITIVE PPD 08/16/2006  . COLONIC POLYPS, BENIGN, HX OF 08/16/2006    Past Surgical History:  Procedure Laterality Date  . APPENDECTOMY    . CESAREAN SECTION     x2    . COLONOSCOPY  05-10-13   per Dr. Henrene Pastor, sessile serrated polyp, repeat in 5 yrs     OB History    No data available       Home Medications    Prior to Admission medications   Medication Sig Start Date End Date Taking? Authorizing Provider  aspirin 81 MG EC tablet Take 81 mg by mouth daily.      [provider]  BIOTIN PO Take by mouth daily.    [provider]  lisinopril (PRINIVIL,ZESTRIL) 10 MG tablet Take 1 tablet (10 mg total) by mouth daily. 03/23/16   Laurey Morale, MD  Multiple Vitamins-Minerals (MULTIVITAMIN PO) Take by mouth daily.    [provider]  sertraline (ZOLOFT) 50 MG tablet Take 1 tablet by mouth  daily 10/08/15   Laurey Morale, MD  temazepam (RESTORIL) 30 MG capsule Take 1 capsule (30 mg total) by mouth at bedtime as needed for sleep. 04/26/16   Laurey Morale, MD    Family History Family History  Problem Relation Age of Onset  . Heart attack Father 24       mi; several brothers with SCD  . Diabetes Father   . Hyperlipidemia Father   . Sudden death Father   . Heart disease Father   . Hypertension Father   . Heart attack Sister   . Hyperlipidemia Sister   . Coronary artery disease Unknown        fm hx  . Diabetes Unknown        fm hx  .  Hyperlipidemia Unknown        fm hx  . Hypertension Unknown        fm hx  . Stroke Unknown        <60 fm hx  . Diabetes Mother   . Hyperlipidemia Mother   . Heart disease Mother   . Hypertension Mother   . Hyperlipidemia Brother   . Diabetes Paternal Aunt   . Heart attack Paternal Aunt   . Hyperlipidemia Paternal Aunt   . Hypertension Paternal Aunt   . Sudden death Paternal Aunt   . Diabetes Paternal Uncle   . Heart attack Paternal Uncle   . Hyperlipidemia Paternal Uncle   . Hypertension Paternal Uncle   . Sudden death Paternal Uncle   . Diabetes Maternal Grandfather   . Sudden death Maternal Grandfather   . Hypertension Maternal Grandfather   . Hyperlipidemia Maternal  Grandfather   . Heart attack Maternal Grandfather   . Diabetes Paternal Grandmother   . Sudden death Paternal Grandmother   . Hyperlipidemia Paternal Grandmother   . Heart attack Paternal Grandmother   . Hypertension Paternal Grandmother   . Diabetes Paternal Grandfather   . Sudden death Paternal Grandfather   . Heart attack Paternal Grandfather   . Hyperlipidemia Paternal Grandfather   . Hypertension Paternal Grandfather   . Colon cancer Neg Hx   . Esophageal cancer Neg Hx   . Rectal cancer Neg Hx   . Stomach cancer Neg Hx     Social History Social History  Substance Use Topics  . Smoking status: Never Smoker  . Smokeless tobacco: Never Used  . Alcohol use 4.2 oz/week    7 Glasses of wine per week     Allergies   Patient has no known allergies.   Review of Systems Review of Systems   Physical Exam Updated Vital Signs BP (!) 161/88 (BP Location: Right Arm)   Pulse 60   Temp 97.8 F (36.6 C) (Oral)   Resp 16   SpO2 99%   Physical Exam  Constitutional: She is oriented to person, place, and time. She appears well-developed and well-nourished.  HENT:  Head: Normocephalic and atraumatic.  Mouth/Throat: Oropharynx is clear and moist.  There is a 2.5 cm laceration to the right upper forehead.   Eyes: Pupils are equal, round, and reactive to light.  Neck: Normal range of motion. Neck supple.  Cardiovascular: Normal rate and regular rhythm.   No murmur heard. Pulmonary/Chest: Effort normal and breath sounds normal. No respiratory distress. She has no wheezes. She has no rales.  Abdominal: Soft. Bowel sounds are normal. She exhibits no distension. There is no tenderness.  Musculoskeletal: Normal range of motion.  Neurological: She is alert and oriented to person, place, and time.  Skin: Skin is warm and dry.  Psychiatric: She has a normal mood and affect.  Nursing note and vitals reviewed.    ED Treatments / Results  DIAGNOSTIC STUDIES: Oxygen Saturation is  99% on RA, normal by my interpretation.    COORDINATION OF CARE: 2:04 AM Discussed treatment plan with pt at bedside and pt agreed to plan, which includes sutures and imaging.   Labs (all labs ordered are listed, but only abnormal results are displayed) Labs Reviewed - No data to display  EKG  EKG Interpretation None       Radiology No results found.  Procedures .Marland KitchenLaceration Repair Date/Time: 07/02/2016 2:05 AM Performed by: Veryl Speak Authorized by: Veryl Speak   Consent:    Consent  obtained:  Verbal   Consent given by:  Patient   Risks discussed:  Infection and pain   Alternatives discussed:  No treatment Anesthesia (see MAR for exact dosages):    Anesthesia method:  Local infiltration   Local anesthetic:  Lidocaine 2% WITH epi Laceration details:    Location:  Scalp   Scalp location:  Frontal   Length (cm):  2.5 Repair type:    Repair type:  Simple Pre-procedure details:    Preparation:  Patient was prepped and draped in usual sterile fashion Exploration:    Wound exploration: wound explored through full range of motion     Contaminated: no   Treatment:    Area cleansed with:  Betadine   Amount of cleaning:  Extensive   Irrigation method:  Syringe   Visualized foreign bodies/material removed: no   Skin repair:    Repair method:  Sutures   Suture size:  5-0   Suture material:  Prolene   Suture technique:  Simple interrupted   Number of sutures:  6 Approximation:    Approximation:  Close   Vermilion border: well-aligned   Post-procedure details:    Patient tolerance of procedure:  Tolerated well, no immediate complications Comments:     There was a second laceration to the scalp just above the hairline in a V-shaped measuring approximately 2.5 cm. This was closed with 5 staples.    (including critical care time)  Medications Ordered in ED Medications - No data to display   Initial Impression / Assessment and Plan / ED Course  I have reviewed  the triage vital signs and the nursing notes.  Pertinent labs & imaging results that were available during my care of the patient were reviewed by me and considered in my medical decision making (see chart for details).  Patient presents here after a fall. She was walking to go to the bathroom and struck her head on the door causing a laceration to her forehead and scalp. She denies loss of consciousness but complains of headache and neck pain. Lacerations were repaired as above. She is neurologically intact and ct of the head and cervical spine are negative. She will be discharged with local wound care and suture removal in the next 6-7 days.  Final Clinical Impressions(s) / ED Diagnoses   Final diagnoses:  None    New Prescriptions New Prescriptions   No medications on file   I personally performed the services described in this documentation, which was scribed in my presence. The recorded information has been reviewed and is accurate.       Veryl Speak, MD 07/02/16 0330

## 2016-07-02 NOTE — ED Triage Notes (Signed)
Pt presents to the ED after having a trip and fall where she hit her head, possibly on the door handle, and has a 1inch laceration to the right forehead.  Denies LOC, denies blood thinners.  Last tetanus shot - unknown.

## 2016-07-02 NOTE — ED Notes (Signed)
ED Provider at bedside. 

## 2016-07-02 NOTE — ED Notes (Signed)
Patient transported to CT 

## 2016-07-02 NOTE — Discharge Instructions (Signed)
Local wound care with bacitracin and dressing changes twice daily.  Sutures are to be removed in 6-7 days. Please follow-up with your primary Dr. for this.   Return to the emergency department in the meantime if you develop increased swelling, redness, pus draining from the wound, or other new and concerning symptoms.

## 2016-07-13 ENCOUNTER — Ambulatory Visit (INDEPENDENT_AMBULATORY_CARE_PROVIDER_SITE_OTHER): Payer: 59 | Admitting: Family Medicine

## 2016-07-13 ENCOUNTER — Encounter: Payer: Self-pay | Admitting: Family Medicine

## 2016-07-13 ENCOUNTER — Ambulatory Visit: Payer: 59 | Admitting: Family Medicine

## 2016-07-13 VITALS — BP 152/93 | HR 66 | Temp 98.1°F | Ht 64.0 in | Wt 136.0 lb

## 2016-07-13 DIAGNOSIS — F411 Generalized anxiety disorder: Secondary | ICD-10-CM | POA: Diagnosis not present

## 2016-07-13 DIAGNOSIS — I10 Essential (primary) hypertension: Secondary | ICD-10-CM

## 2016-07-13 DIAGNOSIS — S0101XD Laceration without foreign body of scalp, subsequent encounter: Secondary | ICD-10-CM | POA: Diagnosis not present

## 2016-07-13 MED ORDER — SERTRALINE HCL 50 MG PO TABS: 50.0000 mg | ORAL_TABLET | Freq: Every day | ORAL | 3 refills | Status: DC

## 2016-07-13 MED ORDER — LISINOPRIL 20 MG PO TABS: 20.0000 mg | ORAL_TABLET | Freq: Every day | ORAL | 3 refills | Status: DC

## 2016-07-13 NOTE — Progress Notes (Signed)
   Subjective:    Patient ID: Bethany Wade, female    DOB: 08/10/1960, 56 y.o.   MRN: [id number]  HPI Here to follow up an ER visit on 07-02-16 for a laceration to the forehead from falling. She tripped over a throw rug in her bathroom and apparently struck something. There was no LOC. In the ER she had 5 staples placed and 6 sutures placed in 2 separate lacerations. She has felt fine since then. She also needs med refills today.    Review of Systems  Constitutional: Negative.   HENT: Negative.   Eyes: Negative.   Respiratory: Negative.   Cardiovascular: Negative.   Neurological: Negative.        Objective:   Physical Exam  Constitutional: She is oriented to person, place, and time. She appears well-developed and well-nourished.  Cardiovascular: Normal rate, regular rhythm, normal heart sounds and intact distal pulses.   Pulmonary/Chest: Effort normal and breath sounds normal.  Neurological: She is alert and oriented to person, place, and time.  Skin:  There is a V shaped laceration on the right parietal scalp just inside the hair line. There is also a linear laceration on the right forehead. Both look clean           Assessment & Plan:  Both lacerations are healing nicely. All sutures and staples were removed. Her anxiety is stable and we refilled her Zoloft but for the HTN we will increase the Lisinopril to 20 mg a day.  Alysia Penna, MD

## 2016-07-13 NOTE — Patient Instructions (Signed)
WE NOW OFFER   [facility]ST [facility]!!!  SAME DAY Appointments for ACUTE CARE  Such as: Sprains, Injuries, cuts, abrasions, rashes, muscle pain, joint pain, back pain Colds, flu, sore throats, headache, allergies, cough, fever  Ear pain, sinus and eye infections Abdominal pain, nausea, vomiting, diarrhea, upset stomach Animal/insect bites  3 Easy Ways to Schedule: Walk-In Scheduling Call in scheduling Mychart Sign-up: [email or web address]         

## 2016-09-07 ENCOUNTER — Other Ambulatory Visit: Payer: Self-pay | Admitting: Family Medicine

## 2016-09-08 NOTE — Telephone Encounter (Signed)
I left a voice message for pt to return my call. We sent local recently, does she want to change to the mail order now?

## 2016-09-08 NOTE — Telephone Encounter (Signed)
I spoke with pt and she prefers the local pharmacy, this was sent in June 2018 with refills

## 2016-10-13 ENCOUNTER — Ambulatory Visit: Payer: 59 | Admitting: Family Medicine

## 2016-10-13 ENCOUNTER — Ambulatory Visit (INDEPENDENT_AMBULATORY_CARE_PROVIDER_SITE_OTHER): Payer: 59 | Admitting: Family Medicine

## 2016-10-13 ENCOUNTER — Encounter: Payer: Self-pay | Admitting: Family Medicine

## 2016-10-13 VITALS — BP 119/75 | HR 69 | Temp 98.7°F | Ht 64.0 in | Wt 130.0 lb

## 2016-10-13 DIAGNOSIS — Z23 Encounter for immunization: Secondary | ICD-10-CM | POA: Diagnosis not present

## 2016-10-13 DIAGNOSIS — M7122 Synovial cyst of popliteal space [Baker], left knee: Secondary | ICD-10-CM

## 2016-10-13 NOTE — Patient Instructions (Signed)
WE NOW OFFER   [facility] FAST [facility]!!!  SAME DAY Appointments for ACUTE CARE  Such as: Sprains, Injuries, cuts, abrasions, rashes, muscle pain, joint pain, back pain Colds, flu, sore throats, headache, allergies, cough, fever  Ear pain, sinus and eye infections Abdominal pain, nausea, vomiting, diarrhea, upset stomach Animal/insect bites  3 Easy Ways to Schedule: Walk-In Scheduling Call in scheduling Mychart Sign-up: [email or web address]         

## 2016-10-13 NOTE — Progress Notes (Signed)
   Subjective:    Patient ID: Bethany Wade, female    DOB: [birth year], 56 y.o.   MRN: [id number]  HPI Here for one week of a painful lump behind the left knee. She has had some cracking and mild pain in this knee for the past several years. No foot swelling. She is active playing tennis, etc.    Review of Systems  Constitutional: Negative.   Respiratory: Negative.   Cardiovascular: Positive for leg swelling. Negative for chest pain and palpitations.  Musculoskeletal: Positive for arthralgias.  Neurological: Negative.        Objective:   Physical Exam  Constitutional: She appears well-developed and well-nourished. No distress.  Cardiovascular: Normal rate, regular rhythm, normal heart sounds and intact distal pulses.   Pulmonary/Chest: Effort normal and breath sounds normal. No respiratory distress. She has no wheezes. She has no rales.  Musculoskeletal: She exhibits no edema.  The left knee has a tender cystic lump in the popliteal space. No erythema or warmth. Full ROM.           Assessment & Plan:  This is likely a Bakers cyst. She can use warm compresses and Ibuprofen. We will set up a venous doppler of the leg soon.  Alysia Penna, MD

## 2016-10-14 ENCOUNTER — Encounter: Payer: Self-pay | Admitting: Family Medicine

## 2016-10-18 ENCOUNTER — Ambulatory Visit (INDEPENDENT_AMBULATORY_CARE_PROVIDER_SITE_OTHER): Payer: 59 | Admitting: Orthopedic Surgery

## 2016-10-18 ENCOUNTER — Encounter (INDEPENDENT_AMBULATORY_CARE_PROVIDER_SITE_OTHER): Payer: Self-pay | Admitting: Orthopedic Surgery

## 2016-10-18 ENCOUNTER — Ambulatory Visit (INDEPENDENT_AMBULATORY_CARE_PROVIDER_SITE_OTHER): Payer: 59

## 2016-10-18 DIAGNOSIS — M25562 Pain in left knee: Secondary | ICD-10-CM

## 2016-10-19 NOTE — Progress Notes (Signed)
Office Visit Note   Patient: Bethany Wade           Date of Birth: September 07, 1960           MRN: [id number] Visit Date: 10/18/2016 Requested by: Laurey Morale, MD [place], [place] [address] PCP: Laurey Morale, MD  Subjective: Chief Complaint  Patient presents with  . Left Knee - Pain    HPI: Bethany Wade is a 56 year old patient with left knee pain.  Started a week ago with pain behind the knee.  She has tried ice and ibuprofen.  Initially was painful to weight-bear but it has improved.  She reports a little bit of catching and grabbing.  Reports some pain from the calf to the knee.  No family or personal history of DVT.  She has discontinued her work at [employer].  She really can't do that but can't because of the physical intensity of the training.  She denies much in the way of mechanical symptoms.  Localizes the pain primarily posterior and posterior lateral.             ROS: All systems reviewed are negative as they relate to the chief complaint within the history of present illness.  Patient denies  fevers or chills.   Assessment & Plan: Visit Diagnoses:  1. Acute pain of left knee     Plan: impression is left knee pain with trace effusion in the knee.  She does have a Baker cyst which we confirmed by looking at it with ultrasound.  I think it's conceivable that she may have meniscal pathology present.  She's having no mechanical symptoms.  Plan is noted to keep Bethany Wade from doing boot Which she cannot do right now because of the intensity of training.  I'm also going to give her duexis samples 1 per day.  We'll have her do that for 18 days.hold off on tennis for 2 weeks.  Follow-up with me in 4 weeks at which time our decision point will be for or against injection and for or against MRI scan  Follow-Up Instructions: Return in about 4 weeks (around 11/15/2016).   Orders:  Orders Placed This Encounter  Procedures  . XR KNEE 3 VIEW LEFT   No orders of the  defined types were placed in this encounter.     Procedures: No procedures performed   Clinical Data: No additional findings.  Objective: Vital Signs: There were no vitals taken for this visit.  Physical Exam:   Constitutional: Patient appears well-developed HEENT:  Head: Normocephalic Eyes:EOM are normal Neck: Normal range of motion Cardiovascular: Normal rate Pulmonary/chest: Effort normal Neurologic: Patient is alert Skin: Skin is warm Psychiatric: Patient has normal mood and affect    Ortho Exam: orthopedic exam demonstrates full active and passive range of motion of the left knee with trace effusion stable collateral cruciate ligaments mild patellofemoral crepitus she does have a palpable Baker cyst confirmed with ultrasound.  Equivocal McMurray compression testing no groin pain with internal/external rotation of the leg  Specialty Comments:  No specialty comments available.  Imaging: Xr Knee 3 View Left  Result Date: 10/19/2016 AP lateral merchant left knee reviewed. No arthritis is present.  No loose bodies no fractures.  Normal left knee    PMFS History: Patient Active Problem List   Diagnosis Date Noted  . HTN (hypertension) 02/13/2014  . GERD (gastroesophageal reflux disease) 03/07/2013  . Other and unspecified hyperlipidemia 07/25/2012  . Left shoulder pain 12/14/2010  .  Left leg pain 12/01/2010  . MIGRAINE HEADACHE 06/06/2009  . TALIPES CAVUS 04/28/2009  . WRIST PAIN 03/25/2009  . GANGLION CYST, WRIST, RIGHT 03/25/2009  . TOE PAIN 03/25/2009  . BACTERIURIA 10/08/2008  . LIVER FUNCTION TESTS, ABNORMAL, HX OF 10/08/2008  . INSOMNIA 10/16/2007  . ACUTE SINUSITIS, UNSPECIFIED 02/21/2007  . Anxiety state 08/16/2006  . DIZZINESS 08/16/2006  . POSITIVE PPD 08/16/2006  . COLONIC POLYPS, BENIGN, HX OF 08/16/2006   Past Medical History:  Diagnosis Date  . Anxiety   . GERD (gastroesophageal reflux disease)   . Hypertension   . Insomnia     Family  History  Problem Relation Age of Onset  . Heart attack Father 26       mi; several brothers with SCD  . Diabetes Father   . Hyperlipidemia Father   . Sudden death Father   . Heart disease Father   . Hypertension Father   . Heart attack Sister   . Hyperlipidemia Sister   . Coronary artery disease Unknown        fm hx  . Diabetes Unknown        fm hx  . Hyperlipidemia Unknown        fm hx  . Hypertension Unknown        fm hx  . Stroke Unknown        <60 fm hx  . Diabetes Mother   . Hyperlipidemia Mother   . Heart disease Mother   . Hypertension Mother   . Hyperlipidemia Brother   . Diabetes Paternal Aunt   . Heart attack Paternal Aunt   . Hyperlipidemia Paternal Aunt   . Hypertension Paternal Aunt   . Sudden death Paternal Aunt   . Diabetes Paternal Uncle   . Heart attack Paternal Uncle   . Hyperlipidemia Paternal Uncle   . Hypertension Paternal Uncle   . Sudden death Paternal Uncle   . Diabetes Maternal Grandfather   . Sudden death Maternal Grandfather   . Hypertension Maternal Grandfather   . Hyperlipidemia Maternal Grandfather   . Heart attack Maternal Grandfather   . Diabetes Paternal Grandmother   . Sudden death Paternal Grandmother   . Hyperlipidemia Paternal Grandmother   . Heart attack Paternal Grandmother   . Hypertension Paternal Grandmother   . Diabetes Paternal Grandfather   . Sudden death Paternal Grandfather   . Heart attack Paternal Grandfather   . Hyperlipidemia Paternal Grandfather   . Hypertension Paternal Grandfather   . Colon cancer Neg Hx   . Esophageal cancer Neg Hx   . Rectal cancer Neg Hx   . Stomach cancer Neg Hx     Past Surgical History:  Procedure Laterality Date  . APPENDECTOMY    . CESAREAN SECTION     x2  . COLONOSCOPY  05-10-13   per Dr. Henrene Pastor, sessile serrated polyp, repeat in 5 yrs    Social History   Occupational History  . Not on file.   Social History Main Topics  . Smoking status: Never Smoker  . Smokeless  tobacco: Never Used  . Alcohol use 4.2 oz/week    7 Glasses of wine per week  . Drug use: No  . Sexual activity: Not on file

## 2016-10-20 ENCOUNTER — Ambulatory Visit (INDEPENDENT_AMBULATORY_CARE_PROVIDER_SITE_OTHER): Payer: Self-pay | Admitting: Orthopedic Surgery

## 2016-11-10 ENCOUNTER — Other Ambulatory Visit: Payer: Self-pay | Admitting: Family Medicine

## 2016-11-10 NOTE — Telephone Encounter (Signed)
Call in #90 with one rf 

## 2016-11-14 ENCOUNTER — Encounter (HOSPITAL_COMMUNITY): Payer: Self-pay | Admitting: Emergency Medicine

## 2016-11-14 ENCOUNTER — Emergency Department (HOSPITAL_COMMUNITY): Payer: 59

## 2016-11-14 ENCOUNTER — Emergency Department (HOSPITAL_COMMUNITY)
Admission: EM | Admit: 2016-11-14 | Discharge: 2016-11-14 | Disposition: A | Discharge status: Home / Self Care | Payer: 59 | Attending: Emergency Medicine | Admitting: Emergency Medicine

## 2016-11-14 DIAGNOSIS — R072 Precordial pain: Secondary | ICD-10-CM | POA: Diagnosis not present

## 2016-11-14 DIAGNOSIS — I10 Essential (primary) hypertension: Secondary | ICD-10-CM | POA: Diagnosis not present

## 2016-11-14 DIAGNOSIS — R079 Chest pain, unspecified: Secondary | ICD-10-CM | POA: Diagnosis not present

## 2016-11-14 DIAGNOSIS — Z7982 Long term (current) use of aspirin: Secondary | ICD-10-CM | POA: Insufficient documentation

## 2016-11-14 LAB — BASIC METABOLIC PANEL
Anion gap: 6 (ref 5–15)
BUN: 17 mg/dL (ref 6–20)
CALCIUM: 8.9 mg/dL (ref 8.9–10.3)
CHLORIDE: 98 mmol/L — AB (ref 101–111)
CO2: 28 mmol/L (ref 22–32)
CREATININE: 0.82 mg/dL (ref 0.44–1.00)
Glucose, Bld: 109 mg/dL — ABNORMAL HIGH (ref 65–99)
Potassium: 3.4 mmol/L — ABNORMAL LOW (ref 3.5–5.1)
SODIUM: 132 mmol/L — AB (ref 135–145)

## 2016-11-14 LAB — CBC
HEMATOCRIT: 39.8 % (ref 36.0–46.0)
HEMOGLOBIN: 13.4 g/dL (ref 12.0–15.0)
MCH: 28.3 pg (ref 26.0–34.0)
MCHC: 33.7 g/dL (ref 30.0–36.0)
MCV: 84.1 fL (ref 78.0–100.0)
Platelets: 189 10*3/uL (ref 150–400)
RBC: 4.73 MIL/uL (ref 3.87–5.11)
RDW: 13.2 % (ref 11.5–15.5)
WBC: 5.6 10*3/uL (ref 4.0–10.5)

## 2016-11-14 LAB — I-STAT TROPONIN, ED: Troponin i, poc: 0.01 ng/mL (ref 0.00–0.08)

## 2016-11-14 LAB — TROPONIN I: Troponin I: <0.03 ng/mL (ref <0.03)

## 2016-11-14 MED ORDER — IOPAMIDOL (ISOVUE-370) INJECTION 76%
INTRAVENOUS | Status: AC
  Administered 2016-11-14: 100 mL
  Filled 2016-11-14: qty 100

## 2016-11-14 MED ORDER — NITROGLYCERIN 0.4 MG SL SUBL
0.4000 mg | SUBLINGUAL_TABLET | SUBLINGUAL | Status: DC | PRN
  Administered 2016-11-14: 0.4 mg via SUBLINGUAL
  Filled 2016-11-14: qty 1

## 2016-11-14 MED ORDER — SODIUM CHLORIDE 0.9 % IV BOLUS (SEPSIS)
1000.0000 mL | Freq: Once | INTRAVENOUS | Status: AC
  Administered 2016-11-14: 1000 mL via INTRAVENOUS

## 2016-11-14 MED ORDER — POTASSIUM CHLORIDE CRYS ER 20 MEQ PO TBCR
40.0000 meq | EXTENDED_RELEASE_TABLET | Freq: Once | ORAL | Status: AC
  Administered 2016-11-14: 40 meq via ORAL
  Filled 2016-11-14: qty 2

## 2016-11-14 NOTE — Consult Note (Signed)
Requesting physician: Nanda Quinton, EDP  Primary Care Physician: Laurey Morale, MD  Reason for consultation: Back pain   History of Present Illness: Patient is a 56 year old woman who came into the hospital today with complaints of back pain.  She has a history significant for hypertension.  She also takes Zoloft for generalized anxiety disorder.  This morning, when she woke up, she noticed back pain in the center of her back "where her bra strap was".  She asked her husband to "crack her back" this seemed to relieve the pain.  Later in the day at around 11 AM she was at church and again she had this back pain that now radiated to the sides of her rib cage she felt dizzy, her tongue was numb.  She states that these are symptoms that she usually gets with her anxiety attack so she thought this was anxiety.  However she went to see some volunteer nurses at [employer] and they recommended she be brought to the hospital emergency department for evaluation.  In the ED vital signs are noted to be within normal limits, labs are essentially unremarkable with the exception of a slightly low potassium at 3.4, initial troponin was 0.01 drawn at 12:39 PM, chest x-ray without acute abnormality, CT angiogram of the chest negative for PE, negative for thoracic aortic aneurysm or dissection, no other acute abnormalities.  EKG shows normal sinus rhythm but no other acute abnormalities.  Coronary artery disease risk factors include hypertension and family history with a father who died of an MI in his early 57s and a sister who had an MI in her mid 44s.  She is a never smoker.  I have been asked to see patient for consideration of admission.  Allergies:  No Known Allergies    Past Medical History:  Diagnosis Date  . Anxiety   . GERD (gastroesophageal reflux disease)   . Hypertension   . Insomnia     Past Surgical History:  Procedure Laterality Date  . APPENDECTOMY    . CESAREAN SECTION     x2  .  COLONOSCOPY  05-10-13   per Dr. Henrene Pastor, sessile serrated polyp, repeat in 5 yrs     Scheduled Meds: Continuous Infusions: PRN Meds:.nitroGLYCERIN  Social History:  reports that she has never smoked. She has never used smokeless tobacco. She reports that she drinks about 4.2 oz of alcohol per week . She reports that she does not use drugs.  Family History  Problem Relation Age of Onset  . Heart attack Father 76       mi; several brothers with SCD  . Diabetes Father   . Hyperlipidemia Father   . Sudden death Father   . Heart disease Father   . Hypertension Father   . Heart attack Sister 51  . Hyperlipidemia Sister   . Coronary artery disease Unknown        fm hx  . Diabetes Unknown        fm hx  . Hyperlipidemia Unknown        fm hx  . Hypertension Unknown        fm hx  . Stroke Unknown        <60 fm hx  . Diabetes Mother   . Hyperlipidemia Mother   . Heart disease Mother   . Hypertension Mother   . Hyperlipidemia Brother   . Stroke Brother 8  . Diabetes Paternal Aunt   . Heart attack Paternal Aunt   .  Hyperlipidemia Paternal Aunt   . Hypertension Paternal Aunt   . Sudden death Paternal Aunt   . Diabetes Paternal Uncle   . Heart attack Paternal Uncle   . Hyperlipidemia Paternal Uncle   . Hypertension Paternal Uncle   . Sudden death Paternal Uncle   . Diabetes Maternal Grandfather   . Sudden death Maternal Grandfather   . Hypertension Maternal Grandfather   . Hyperlipidemia Maternal Grandfather   . Heart attack Maternal Grandfather   . Diabetes Paternal Grandmother   . Sudden death Paternal Grandmother   . Hyperlipidemia Paternal Grandmother   . Heart attack Paternal Grandmother   . Hypertension Paternal Grandmother   . Diabetes Paternal Grandfather   . Sudden death Paternal Grandfather   . Heart attack Paternal Grandfather   . Hyperlipidemia Paternal Grandfather   . Hypertension Paternal Grandfather   . Colon cancer Neg Hx   . Esophageal cancer Neg Hx   .  Rectal cancer Neg Hx   . Stomach cancer Neg Hx     Review of Systems:  Constitutional: Denies fever, chills, diaphoresis, appetite change and fatigue.  HEENT: Denies photophobia, eye pain, redness, hearing loss, ear pain, congestion, sore throat, rhinorrhea, sneezing, mouth sores, trouble swallowing, neck pain, neck stiffness and tinnitus.   Respiratory: Denies  cough, and wheezing.   Cardiovascular: Denies chest pain, palpitations and leg swelling.  Gastrointestinal: Denies nausea, vomiting, abdominal pain, diarrhea, constipation, blood in stool and abdominal distention.  Genitourinary: Denies dysuria, urgency, frequency, hematuria, flank pain and difficulty urinating.  Endocrine: Denies: hot or cold intolerance, sweats, changes in hair or nails, polyuria, polydipsia. Musculoskeletal: Denies myalgias, back pain, joint swelling, arthralgias and gait problem.  Skin: Denies pallor, rash and wound.  Neurological: Denies dizziness, seizures, syncope, weakness, light-headedness, numbness and headaches.  Hematological: Denies adenopathy. Easy bruising, personal or family bleeding history  Psychiatric/Behavioral: Denies suicidal ideation, mood changes, confusion, nervousness, sleep disturbance and agitation   Physical Exam: Blood pressure 133/76, pulse 66, temperature (!) 97.5 F (36.4 C), temperature source Oral, resp. rate 17, SpO2 100 %. General: Alert, awake, oriented x3, no acute distress HEENT: Normocephalic, atraumatic, pupils equal round and reactive to light, extraocular movements intact, moist mucous membranes Neck: Supple, no JVD, no lymphadenopathy, no bruits, no goiter. Cardiovascular: Regular rate and rhythm, no murmurs, rubs or gallops Lungs: Clear to auscultation bilaterally Abdomen: Soft, nontender, nondistended, positive bowel sounds, no masses or organomegaly noted. Extremities: No clubbing, cyanosis or edema, positive pulses Neurologic: Grossly intact and nonfocal  Labs on  Admission:  Results for orders placed or performed during the hospital encounter of 11/14/16 (from the past 48 hour(s))  Basic metabolic panel     Status: Abnormal   Collection Time: 11/14/16 12:32 PM  Result Value Ref Range   Sodium 132 (L) 135 - 145 mmol/L   Potassium 3.4 (L) 3.5 - 5.1 mmol/L   Chloride 98 (L) 101 - 111 mmol/L   CO2 28 22 - 32 mmol/L   Glucose, Bld 109 (H) 65 - 99 mg/dL   BUN 17 6 - 20 mg/dL   Creatinine, Ser 0.82 0.44 - 1.00 mg/dL   Calcium 8.9 8.9 - 10.3 mg/dL   GFR calc non Af Amer >60 >60 mL/min   GFR calc Af Amer >60 >60 mL/min    Comment: (NOTE) The eGFR has been calculated using the CKD EPI equation. This calculation has not been validated in all clinical situations. eGFR's persistently <60 mL/min signify possible Chronic Kidney Disease.    Anion gap  6 5 - 15  CBC     Status: None   Collection Time: 11/14/16 12:32 PM  Result Value Ref Range   WBC 5.6 4.0 - 10.5 K/uL   RBC 4.73 3.87 - 5.11 MIL/uL   Hemoglobin 13.4 12.0 - 15.0 g/dL   HCT 39.8 36.0 - 46.0 %   MCV 84.1 78.0 - 100.0 fL   MCH 28.3 26.0 - 34.0 pg   MCHC 33.7 30.0 - 36.0 g/dL   RDW 13.2 11.5 - 15.5 %   Platelets 189 150 - 400 K/uL  I-stat troponin, ED     Status: None   Collection Time: 11/14/16 12:39 PM  Result Value Ref Range   Troponin i, poc 0.01 0.00 - 0.08 ng/mL   Comment 3            Comment: Due to the release kinetics of cTnI, a negative result within the first hours of the onset of symptoms does not rule out myocardial infarction with certainty. If myocardial infarction is still suspected, repeat the test at appropriate intervals.     Radiological Exams on Admission: Dg Chest 2 View  Result Date: 11/14/2016 CLINICAL DATA:  Acute chest pain for 1 day. EXAM: CHEST  2 VIEW COMPARISON:  06/05/2009 chest radiograph FINDINGS: The cardiomediastinal silhouette is unremarkable. There is no evidence of focal airspace disease, pulmonary edema, suspicious pulmonary nodule/mass,  pleural effusion, or pneumothorax. No acute bony abnormalities are identified. IMPRESSION: No active cardiopulmonary disease. Electronically Signed   By: Margarette Canada M.D.   On: 11/14/2016 13:49   Ct Angio Chest Pe W And/or Wo Contrast  Result Date: 11/14/2016 CLINICAL DATA:  Chest pain with severe back pain and dizziness. EXAM: CT ANGIOGRAPHY CHEST WITH CONTRAST TECHNIQUE: Multidetector CT imaging of the chest was performed using the standard protocol during bolus administration of intravenous contrast. Multiplanar CT image reconstructions and MIPs were obtained to evaluate the vascular anatomy. CONTRAST:  100 mL of Isovue 370 COMPARISON:  Chest x-ray from earlier today FINDINGS: Cardiovascular: The study was tailored to evaluate the pulmonary arteries. As a result, evaluation the thoracic aorta is somewhat limited due to timing of contrast bolus. The thoracic aorta is normal in caliber with no dissection or significant atherosclerotic change. The pulmonary artery is are well opacified. No pulmonary emboli identified. Mediastinum/Nodes: No enlarged mediastinal, hilar, or axillary lymph nodes. Thyroid gland, trachea, and esophagus demonstrate no significant findings. Lungs/Pleura: Lungs are clear. No pleural effusion or pneumothorax. Upper Abdomen: Low-attenuation in the posterior liver on series 5, image 102 is too small to characterize but favored to represent a cyst based on low attenuation of less than 10. Musculoskeletal: No chest wall abnormality. No acute or significant osseous findings. Review of the MIP images confirms the above findings. IMPRESSION: 1. No pulmonary emboli identified. 2. No thoracic aortic aneurysm or dissection on limited evaluation. 3. Probable cyst in the liver. 4. No other acute abnormalities. Electronically Signed   By: Dorise Bullion III M.D   On: 11/14/2016 16:58    Assessment/Plan  Back pain  -Patient never had true chest pain.  In any case this pain is mostly resolved at  this point. -CT angiogram was negative for PE or aortic dissection. -Heart score is 3 accounting one point for her age and 2 points for her risk factors which puts her at low risk. -EKG is nonacute and initial troponin drawn at 12:39 PM is 0.01. -Have asked EDP to draw another troponin now which would be approximately 6  hours after initial troponin. -If this second troponin is negative, I would advocate for discharge home given atypical pain and low heart score. -If patient is discharged home she should follow-up with her PCP if she has any further similar episodes for outpatient referral to cardiology. -Patient is in agreement with this plan. -If subsequent troponin is positive, can consider admission for full rule out although I would probably not advocate for further inpatient cardiac testing at this time given her low overall risk.  Hypertension -To continue lisinopril.  Generalized anxiety disorder -To continue Zoloft.  Time Spent on Consultation: 85 minutes  HERNANDEZ ACOSTA,ESTELA [facility]  [phone] 11/14/2016, 6:25 PM

## 2016-11-14 NOTE — ED Notes (Signed)
Pt and family verbalized understanding of discharge instructions °

## 2016-11-14 NOTE — ED Provider Notes (Signed)
[facility] EMERGENCY DEPARTMENT Provider Note   CSN: [id number] Arrival date & time: 11/14/16  1217     History   Chief Complaint Chief Complaint  Patient presents with  . Back Pain  . Dizziness  . Chest Pain    HPI Bethany Wade is a 56 y.o. female.  HPI   Bethany Wade is a 56 y.o. female, with a history of GERD and HTN, presenting to the ED with left sided upper back pain sudden onset around 9 AM this morning while at rest. Pain increased and around 11 AM while in church she began to feel short of breath. She noted chest pressure around the sides of her chest. Accompanied by lightheadedness. She has never experienced this sensation before. Initially rates her pain at 7/10, but states it has improved somewhat to 4/10. Denies N/V/D, cough, fever/chills, abdominal pain, diaphoresis, or any other complaints.   Past Medical History:  Diagnosis Date  . Anxiety   . GERD (gastroesophageal reflux disease)   . Hypertension   . Insomnia     Patient Active Problem List   Diagnosis Date Noted  . HTN (hypertension) 02/13/2014  . GERD (gastroesophageal reflux disease) 03/07/2013  . Other and unspecified hyperlipidemia 07/25/2012  . Left shoulder pain 12/14/2010  . Left leg pain 12/01/2010  . MIGRAINE HEADACHE 06/06/2009  . TALIPES CAVUS 04/28/2009  . WRIST PAIN 03/25/2009  . GANGLION CYST, WRIST, RIGHT 03/25/2009  . TOE PAIN 03/25/2009  . BACTERIURIA 10/08/2008  . LIVER FUNCTION TESTS, ABNORMAL, HX OF 10/08/2008  . INSOMNIA 10/16/2007  . ACUTE SINUSITIS, UNSPECIFIED 02/21/2007  . Anxiety state 08/16/2006  . DIZZINESS 08/16/2006  . POSITIVE PPD 08/16/2006  . COLONIC POLYPS, BENIGN, HX OF 08/16/2006    Past Surgical History:  Procedure Laterality Date  . APPENDECTOMY    . CESAREAN SECTION     x2  . COLONOSCOPY  05-10-13   per Dr. Henrene Pastor, sessile serrated polyp, repeat in 5 yrs     OB History    No data available       Home  Medications    Prior to Admission medications   Medication Sig Start Date End Date Taking? Authorizing Provider  acetaminophen (TYLENOL) 500 MG tablet Take 500 mg by mouth every 6 (six) hours as needed for moderate pain.   Yes [provider]  aspirin 81 MG EC tablet Take 81 mg by mouth daily.     Yes [provider]  aspirin-acetaminophen-caffeine (EXCEDRIN MIGRAINE) [id number] MG tablet Take 1 tablet by mouth every 6 (six) hours as needed for headache.   Yes [provider]  lisinopril (PRINIVIL,ZESTRIL) 10 MG tablet Take 10 tablets by mouth daily. 09/07/16  Yes [provider]  magnesium gluconate (MAGONATE) 500 MG tablet Take 500 mg by mouth daily.   Yes [provider]  Menthol-Camphor (TIGER BALM ARTHRITIS RUB EX) Apply 1 application topically as needed (muscle pain).   Yes [provider]  Multiple Vitamins-Minerals (MULTIVITAMIN PO) Take by mouth daily.   Yes [provider]  sertraline (ZOLOFT) 50 MG tablet Take 1 tablet (50 mg total) by mouth daily. 07/13/16  Yes Laurey Morale, MD  temazepam (RESTORIL) 30 MG capsule TAKE ONE CAPSULE AT BEDTIME AS NEEDED FOR SLEEP 11/10/16  Yes Laurey Morale, MD    Family History Family History  Problem Relation Age of Onset  . Heart attack Father 76       mi; several brothers with SCD  .  Diabetes Father   . Hyperlipidemia Father   . Sudden death Father   . Heart disease Father   . Hypertension Father   . Heart attack Sister 77  . Hyperlipidemia Sister   . Coronary artery disease Unknown        fm hx  . Diabetes Unknown        fm hx  . Hyperlipidemia Unknown        fm hx  . Hypertension Unknown        fm hx  . Stroke Unknown        <60 fm hx  . Diabetes Mother   . Hyperlipidemia Mother   . Heart disease Mother   . Hypertension Mother   . Hyperlipidemia Brother   . Stroke Brother 72  . Diabetes Paternal Aunt   . Heart attack Paternal Aunt   . Hyperlipidemia Paternal  Aunt   . Hypertension Paternal Aunt   . Sudden death Paternal Aunt   . Diabetes Paternal Uncle   . Heart attack Paternal Uncle   . Hyperlipidemia Paternal Uncle   . Hypertension Paternal Uncle   . Sudden death Paternal Uncle   . Diabetes Maternal Grandfather   . Sudden death Maternal Grandfather   . Hypertension Maternal Grandfather   . Hyperlipidemia Maternal Grandfather   . Heart attack Maternal Grandfather   . Diabetes Paternal Grandmother   . Sudden death Paternal Grandmother   . Hyperlipidemia Paternal Grandmother   . Heart attack Paternal Grandmother   . Hypertension Paternal Grandmother   . Diabetes Paternal Grandfather   . Sudden death Paternal Grandfather   . Heart attack Paternal Grandfather   . Hyperlipidemia Paternal Grandfather   . Hypertension Paternal Grandfather   . Colon cancer Neg Hx   . Esophageal cancer Neg Hx   . Rectal cancer Neg Hx   . Stomach cancer Neg Hx     Social History Social History  Substance Use Topics  . Smoking status: Never Smoker  . Smokeless tobacco: Never Used  . Alcohol use 4.2 oz/week    7 Glasses of wine per week     Allergies   Patient has no known allergies.   Review of Systems Review of Systems  Constitutional: Negative for chills, diaphoresis and fever.  Respiratory: Positive for shortness of breath. Negative for cough.   Cardiovascular: Positive for chest pain.  Gastrointestinal: Negative for abdominal pain, diarrhea, nausea and vomiting.  Musculoskeletal: Positive for back pain.  Neurological: Negative for dizziness, syncope, weakness and numbness.  All other systems reviewed and are negative.    Physical Exam Updated Vital Signs BP 131/73   Pulse 64   Temp (!) 97.5 F (36.4 C) (Oral)   Resp 12   SpO2 100%   Physical Exam  Constitutional: She appears well-developed and well-nourished. No distress.  HENT:  Head: Normocephalic and atraumatic.  Eyes: Conjunctivae are normal.  Neck: Neck supple.    Cardiovascular: Normal rate, regular rhythm, normal heart sounds and intact distal pulses.   Pulmonary/Chest: Effort normal and breath sounds normal. No respiratory distress.      Abdominal: Soft. There is no tenderness. There is no guarding.  Musculoskeletal: She exhibits no edema.  Lymphadenopathy:    She has no cervical adenopathy.  Neurological: She is alert.  Skin: Skin is warm and dry. She is not diaphoretic.  Psychiatric: She has a normal mood and affect. Her behavior is normal.  Nursing note and vitals reviewed.    ED Treatments / Results  Labs (all labs ordered are listed, but only abnormal results are displayed) Labs Reviewed  BASIC METABOLIC PANEL - Abnormal; Notable for the following:       Result Value   Sodium 132 (*)    Potassium 3.4 (*)    Chloride 98 (*)    Glucose, Bld 109 (*)    All other components within normal limits  CBC  I-STAT TROPONIN, ED    EKG  EKG Interpretation  Date/Time:  Sunday November 14 2016 12:17:19 EDT Ventricular Rate:  61 PR Interval:  140 QRS Duration: 86 QT Interval:  412 QTC Calculation: 414 R Axis:   88 Text Interpretation:  Normal sinus rhythm Nonspecific T wave abnormality Abnormal ECG No STEMI.  Confirmed by Nanda Quinton [phone]) on 11/14/2016 3:12:56 PM       Radiology Dg Chest 2 View  Result Date: 11/14/2016 CLINICAL DATA:  Acute chest pain for 1 day. EXAM: CHEST  2 VIEW COMPARISON:  06/05/2009 chest radiograph FINDINGS: The cardiomediastinal silhouette is unremarkable. There is no evidence of focal airspace disease, pulmonary edema, suspicious pulmonary nodule/mass, pleural effusion, or pneumothorax. No acute bony abnormalities are identified. IMPRESSION: No active cardiopulmonary disease. Electronically Signed   By: Margarette Canada M.D.   On: 11/14/2016 13:49    Procedures Procedures (including critical care time)  Medications Ordered in ED Medications  iopamidol (ISOVUE-370) 76 % injection (not administered)      Initial Impression / Assessment and Plan / ED Course  I have reviewed the triage vital signs and the nursing notes.  Pertinent labs & imaging results that were available during my care of the patient were reviewed by me and considered in my medical decision making (see chart for details).      Patient presents with mid-back pain with sudden onset accompanied by shortness of breath. Presentation is suspicious enough to warrant further workup for PE versus ACS. HEART score is 5, indicating moderate risk for a cardiac event. Wells criteria score is 3, indicating moderate risk for PE.  Findings and plan of care discussed with Nanda Quinton, MD. Dr. Laverta Baltimore continued care of this patient after the end of my shift.    Final Clinical Impressions(s) / ED Diagnoses   Final diagnoses:  None    New Prescriptions New Prescriptions   No medications on file     Layla Maw 11/14/16 1642    Long, Wonda [facility], MD 11/15/16 1030

## 2016-11-14 NOTE — Discharge Instructions (Signed)

## 2016-11-14 NOTE — ED Triage Notes (Signed)
Back pain since this am and then she started to feel dizzy , feels pressure in chest

## 2016-11-14 NOTE — ED Notes (Addendum)
Pt is in CT

## 2016-11-14 NOTE — ED Notes (Signed)
ED Provider at bedside. 

## 2016-11-15 ENCOUNTER — Encounter (INDEPENDENT_AMBULATORY_CARE_PROVIDER_SITE_OTHER): Payer: Self-pay

## 2016-11-15 ENCOUNTER — Ambulatory Visit (INDEPENDENT_AMBULATORY_CARE_PROVIDER_SITE_OTHER): Payer: 59 | Admitting: Orthopedic Surgery

## 2016-11-16 ENCOUNTER — Encounter: Payer: Self-pay | Admitting: Family Medicine

## 2016-11-16 ENCOUNTER — Ambulatory Visit (INDEPENDENT_AMBULATORY_CARE_PROVIDER_SITE_OTHER): Payer: 59 | Admitting: Family Medicine

## 2016-11-16 VITALS — BP 94/64 | HR 69 | Temp 98.2°F | Ht 64.0 in | Wt 128.0 lb

## 2016-11-16 DIAGNOSIS — R079 Chest pain, unspecified: Secondary | ICD-10-CM | POA: Diagnosis not present

## 2016-11-16 DIAGNOSIS — K219 Gastro-esophageal reflux disease without esophagitis: Secondary | ICD-10-CM | POA: Diagnosis not present

## 2016-11-16 NOTE — Patient Instructions (Signed)
WE NOW OFFER   [facility] FAST TRACK!!!  SAME DAY Appointments for ACUTE CARE  Such as: Sprains, Injuries, cuts, abrasions, rashes, muscle pain, joint pain, back pain Colds, flu, sore throats, headache, allergies, cough, fever  Ear pain, sinus and eye infections Abdominal pain, nausea, vomiting, diarrhea, upset stomach Animal/insect bites  3 Easy Ways to Schedule: Walk-In Scheduling Call in scheduling Mychart Sign-up: [email or web address]         

## 2016-11-16 NOTE — Progress Notes (Signed)
   Subjective:    Patient ID: Bethany Wade, female    DOB: 16-Jul-1960, 56 y.o.   MRN: [id number]  HPI Here to follow up an overnight hospital stay from 11-14-16 to 11-15-16 for the sudden onset of chest pain, upper back pain, and SOB. She has never had these symptoms before. She is very active, works out, and plays tennis frequently and she never as a problem. In the ER her exam was normal and EKG was normal. Labs were all normal, including troponin levels. A chest CT angiogram was normal, thus ruling out PE. Her chest pain resolved with some SL NTG. She was told to see [place], and she is scheduled for a Cardiology visit tomorrow with Dr. Marlou Porch. She has felt fine since that day. She has had trouble with heartburn in the past, but has not had this for a long time.   Review of Systems  Constitutional: Negative.   Respiratory: Negative.   Cardiovascular: Negative.   Gastrointestinal: Negative.   Neurological: Negative.   Psychiatric/Behavioral: Negative.        Objective:   Physical Exam  Constitutional: She is oriented to person, place, and time. She appears well-developed and well-nourished. No distress.  Neck: No thyromegaly present.  Cardiovascular: Normal rate, regular rhythm, normal heart sounds and intact distal pulses.   Pulmonary/Chest: Effort normal. No respiratory distress. She has no wheezes. She has no rales. She exhibits no tenderness.  Abdominal: Soft. Bowel sounds are normal. She exhibits no distension and no mass. There is no tenderness. There is no rebound and no guarding.  Lymphadenopathy:    She has no cervical adenopathy.  Neurological: She is alert and oriented to person, place, and time.          Assessment & Plan:  Single episode of chest pain, most consistent with esophageal spasm. She likely has silent GERD. She will start taking Omeprazole 20 mg daily. See Cardiology tomorrow. She would benefit from stress testing in light of her family history.  Alysia Penna, MD

## 2016-11-17 ENCOUNTER — Ambulatory Visit (INDEPENDENT_AMBULATORY_CARE_PROVIDER_SITE_OTHER): Payer: 59 | Admitting: Cardiology

## 2016-11-17 ENCOUNTER — Encounter: Payer: Self-pay | Admitting: Cardiology

## 2016-11-17 VITALS — BP 106/76 | HR 84 | Resp 16 | Ht 64.0 in | Wt 127.6 lb

## 2016-11-17 DIAGNOSIS — Z8249 Family history of ischemic heart disease and other diseases of the circulatory system: Secondary | ICD-10-CM

## 2016-11-17 DIAGNOSIS — R0789 Other chest pain: Secondary | ICD-10-CM | POA: Diagnosis not present

## 2016-11-17 DIAGNOSIS — I10 Essential (primary) hypertension: Secondary | ICD-10-CM

## 2016-11-17 NOTE — Patient Instructions (Addendum)
Medication Instructions:  The current medical regimen is effective;  continue present plan and medications.  Testing/Procedures: Your physician has requested that you have cardiac CT. Cardiac computed tomography (CT) is a painless test that uses an x-ray machine to take clear, detailed pictures of your heart. For further information please visit [email or web address].  Follow-Up: Follow up as needed after the above testing.  Thank you for choosing [facility]!!     CT Scan A computed tomography (CT) scan is a specialized X-ray scan. It uses X-rays and a computer to make pictures of different areas of your body. A CT scan can offer more detailed information than a regular X-ray exam. The CT scan provides data about internal organs, soft tissue structures, blood vessels, and bones. The CT scanner is a large machine that takes pictures of your body as you move through the opening. Tell a health care provider about:  Any allergies you have.  All medicines you are taking, including vitamins, herbs, eye drops, creams, and over-the-counter medicines.  Any problems you or family members have had with anesthetic medicines.  Any blood disorders you have.  Any surgeries you have had.  Any medical conditions you have. What are the risks? Generally, this is a safe procedure. However, as with any procedure, problems can occur. Possible problems include:  An allergic reaction to the contrast material.  Development of cancer from excessive exposure to radiation. The risk of this is small.  What happens before the procedure?  The day before the test, stop drinking caffeinated beverages. These include energy drinks, tea, soda, coffee, and hot chocolate.  On the day of the test: ? About 4 hours before the test, stop eating and drinking anything but water as advised by your health care provider. ? Avoid wearing jewelry. You will have to partly or fully undress and wear a hospital  gown. What happens during the procedure?  You will be asked to lie on a table with your arms above your head.  If contrast dye is to be used for the test, an IV tube will be inserted in your arm. The contrast dye will be injected into the IV tube. You might feel warm, or you may get a metallic taste in your mouth.  The table you will be lying on will move into a large machine that will do the scanning.  You will be able to see, hear, and talk to the person running the machine while you are in it. Follow that person's directions.  The CT machine will move around you to take pictures. Do not move while it is scanning. This helps to get a good image.  When the best possible pictures have been taken, the machine will be turned off. The table will be moved out of the machine. The IV tube will then be removed. What happens after the procedure? Ask your health care provider when to follow up for your test results. This information is not intended to replace advice given to you by your health care provider. Make sure you discuss any questions you have with your health care provider. Document Released: 02/19/2004 Document Revised: 06/19/2015 Document Reviewed: 09/18/2012 Elsevier Interactive Patient Education  2017 [employer].

## 2016-11-17 NOTE — Progress Notes (Addendum)
Cardiology Office Note:    Date:  11/17/2016   ID:  Marylu Lund Dittman, DOB 1960-07-25, MRN [id number]  PCP:  Laurey Morale, MD  Cardiologist:  Candee Furbish, MD   Referring MD: Laurey Morale, MD     History of Present Illness:    Bethany Wade is a 56 y.o. female here for the evaluation of chest pain at the request of Dr. Sarajane Jews.  She was seen in the emergency department on 11/15/16 for chest wall pain as well as atypical back pain.  She has a history of hypertension and family history of acute coronary syndrome with many of her first-degree relatives having heart attacks and strokes in their early 42s.  She was thoroughly evaluated with CTA of the chest which was negative for pulmonary embolism and troponin was negative.  EKG did not show any ST segment changes.  Overall reassuring workup however pain did improve with nitroglycerin administration.  Originally there was desire to observe her overnight but repeat troponin was negative and she desired to go home.  She is here for close follow-up evaluation.  Felt pain in loe to mid back first after getting coffee, newspaper. Went to [employer], felt back mid scapular pain. Felt like it needed to be cracked. Daughter cracked back in church. Felt some dizzy. Feeling like pass out. BP was a little high. Dr. Clarnce Flock in church. Worried about PE. Father died 77 and sister died 13 MI, Brother 34 cva.   Upon further review of her ECG on 11/14/16, there was T wave inversion that was noted in the precordial leads, V1 through V3 with flattening in V4.  This could be consistent with anterior ischemia. Prior EKG in 2016 showed flattening of T wave in V3 and biphasic in V2.  Been taking lisinopril and ASA. Tc <200. Diet ok.    Past Medical History:  Diagnosis Date  . Anxiety   . GERD (gastroesophageal reflux disease)   . Hypertension   . Insomnia     Past Surgical History:  Procedure Laterality Date  . APPENDECTOMY    . CESAREAN SECTION     x2    . COLONOSCOPY  05-10-13   per Dr. Henrene Pastor, sessile serrated polyp, repeat in 5 yrs     Current Medications: Current Meds  Medication Sig  . acetaminophen (TYLENOL) 500 MG tablet Take 500 mg by mouth every 6 (six) hours as needed for moderate pain.  Marland Kitchen aspirin 81 MG EC tablet Take 81 mg by mouth daily.    Marland Kitchen aspirin-acetaminophen-caffeine (EXCEDRIN MIGRAINE) 250-250-65 MG tablet Take 1 tablet by mouth every 6 (six) hours as needed for headache.  . lisinopril (PRINIVIL,ZESTRIL) 10 MG tablet Take 10 tablets by mouth daily.  . magnesium gluconate (MAGONATE) 500 MG tablet Take 500 mg by mouth daily.  . Menthol-Camphor (TIGER BALM ARTHRITIS RUB EX) Apply 1 application topically as needed (muscle pain).  . Multiple Vitamins-Minerals (MULTIVITAMIN PO) Take by mouth daily.  . sertraline (ZOLOFT) 50 MG tablet Take 1 tablet (50 mg total) by mouth daily.  . temazepam (RESTORIL) 30 MG capsule TAKE ONE CAPSULE AT BEDTIME AS NEEDED FOR SLEEP     Allergies:   Patient has no known allergies.   Social History   Social History  . Marital status: Married    Spouse name: N/A  . Number of children: N/A  . Years of education: N/A   Social History Main Topics  . Smoking status: Never Smoker  . Smokeless tobacco: Never Used  .  Alcohol use 4.2 oz/week    7 Glasses of wine per week  . Drug use: No  . Sexual activity: Not Asked   Other Topics Concern  . None   Social History Narrative  . None     Family History: The patient's family history includes Coronary artery disease in her unknown relative; Diabetes in her father, maternal grandfather, mother, paternal aunt, paternal grandfather, paternal grandmother, paternal uncle, and unknown relative; Heart attack in her maternal grandfather, paternal aunt, paternal grandfather, paternal grandmother, and paternal uncle; Heart attack (age of onset: 29) in her father; Heart attack (age of onset: 5) in her sister; Heart disease in her father and mother;  Hyperlipidemia in her brother, father, maternal grandfather, mother, paternal aunt, paternal grandfather, paternal grandmother, paternal uncle, sister, and unknown relative; Hypertension in her father, maternal grandfather, mother, paternal aunt, paternal grandfather, paternal grandmother, paternal uncle, and unknown relative; Stroke in her unknown relative; Stroke (age of onset: 99) in her brother; Sudden death in her father, maternal grandfather, paternal aunt, paternal grandfather, paternal grandmother, and paternal uncle. There is no history of Colon cancer, Esophageal cancer, Rectal cancer, or Stomach cancer.  ROS:   Please see the history of present illness.   No fevers chills orthopnea PND syncope bleeding.  She has had shortness of breath with activity, chest pain, chest pressure, back pain, dizziness, headaches  All other systems reviewed and are negative.  EKGs/Labs/Other Studies Reviewed:    The following studies were reviewed today: CT scan for PE personally reviewed, no coronary artery calcification noted.  Prior ER notes reviewed, multiple EKG reviewed, heart rate 61 bpm  EKG:  EKG is not ordered today.  Prior EKG reviewed demonstrating sinus rhythm with T wave inversion in the precordial leads, possible ischemia, heart rate 61 bpm  Recent Labs: 03/16/2016: ALT 34; TSH 1.58 11/14/2016: BUN 17; Creatinine, Ser 0.82; Hemoglobin 13.4; Platelets 189; Potassium 3.4; Sodium 132  Recent Lipid Panel    Component Value Date/Time   CHOL 167 03/16/2016 0913   TRIG 76.0 03/16/2016 0913   HDL 57.90 03/16/2016 0913   CHOLHDL 3 03/16/2016 0913   VLDL 15.2 03/16/2016 0913   LDLCALC 94 03/16/2016 0913   LDLDIRECT 107.4 07/25/2012 1338    Physical Exam:    VS:  BP 106/76   Pulse 84   Resp 16   Ht 5\' 4"  (1.626 m)   Wt 127 lb 9.6 oz (57.9 kg)   SpO2 97%   BMI 21.90 kg/m     Wt Readings from Last 3 Encounters:  11/17/16 127 lb 9.6 oz (57.9 kg)  11/16/16 128 lb (58.1 kg)  10/13/16  130 lb (59 kg)     GEN:  Well nourished, well developed in no acute distress HEENT: Normal NECK: No JVD; No carotid bruits LYMPHATICS: No lymphadenopathy CARDIAC: RRR, no murmurs, rubs, gallops RESPIRATORY:  Clear to auscultation without rales, wheezing or rhonchi  ABDOMEN: Soft, non-tender, non-distended MUSCULOSKELETAL:  No edema; No deformity  SKIN: Warm and dry NEUROLOGIC:  Alert and oriented x 3 PSYCHIATRIC:  Normal affect   ASSESSMENT:    1. Atypical chest pain   2. Essential hypertension   3. Family history of early CAD    PLAN:    In order of problems listed above:  Atypical chest pain with abnormal EKG and strong family history of CAD/stroke  -Given her T wave inversions on ECG and chest discomfort albeit atypical, I would like to proceed with coronary CT scan for further anatomical  evaluation.  There was no evidence of coronary artery calcification on PE protocol CT.  I think the study would be helpful to exclude occlusive coronary artery disease.  T wave inversion in the territory that we see on ECG sometimes is noted with LAD territory.  -In the meantime, continue with aggressive primary prevention strategies.  She diets, exercise.  Overall trying to take care of herself unlike some of her siblings who smoked and were overweight.  We will follow-up with results of CT scan.  Insurance preauthorization.  Medication Adjustments/Labs and Tests Ordered: Current medicines are reviewed at length with the patient today.  Concerns regarding medicines are outlined above.  Orders Placed This Encounter  Procedures  . CT CORONARY MORPH W/CTA COR W/SCORE W/CA W/CM &/OR WO/CM  . CT CORONARY FRACTIONAL FLOW RESERVE DATA PREP  . CT CORONARY FRACTIONAL FLOW RESERVE FLUID ANALYSIS   No orders of the defined types were placed in this encounter.   Signed, Candee Furbish, MD  11/17/2016 9:37 AM    [place]

## 2016-12-13 ENCOUNTER — Other Ambulatory Visit: Payer: Self-pay | Admitting: *Deleted

## 2016-12-13 DIAGNOSIS — I10 Essential (primary) hypertension: Secondary | ICD-10-CM

## 2016-12-13 DIAGNOSIS — Z01812 Encounter for preprocedural laboratory examination: Secondary | ICD-10-CM

## 2016-12-13 NOTE — Progress Notes (Signed)
Lab ordered prior to cardiac ct.   Please arrive at the [facility] main entrance of [facility] at 8:00 AM (30-45 minutes prior to test start time)  [facility] [address] [place], [place] [address] [phone]  Proceed to the [facility] Radiology Department (First Floor).  Please follow these instructions carefully (unless otherwise directed):  On the Day of the Test: . Drink plenty of water. Do not drink any water within one hour of the test. . Do not eat any food 4 hours prior to the test. . You may take your regular medications prior to the test. . IF NOT ON A BETA BLOCKER - Take 50 mg of lopressor (metoprolol) one hour before the test. . HOLD Furosemide morning of the test.  After the Test: . Drink plenty of water. . After receiving IV contrast, you may experience a mild flushed feeling. This is normal. . On occasion, you may experience a mild rash up to 24 hours after the test. This is not dangerous. If this occurs, you can take Benadryl 25 mg and increase your fluid intake. . If you experience trouble breathing, this can be serious. If it is severe call 911 IMMEDIATELY. If it is mild, please call our office. . If you take any of these medications: Glipizide/Metformin, Avandament, Glucavance, please do not take 48 hours after completing test.

## 2016-12-14 ENCOUNTER — Telehealth: Payer: Self-pay | Admitting: *Deleted

## 2016-12-14 ENCOUNTER — Other Ambulatory Visit: Payer: 59 | Admitting: *Deleted

## 2016-12-14 DIAGNOSIS — I10 Essential (primary) hypertension: Secondary | ICD-10-CM | POA: Diagnosis not present

## 2016-12-14 DIAGNOSIS — Z01812 Encounter for preprocedural laboratory examination: Secondary | ICD-10-CM

## 2016-12-14 LAB — BASIC METABOLIC PANEL
BUN / CREAT RATIO: 23 (ref 9–23)
BUN: 21 mg/dL (ref 6–24)
CALCIUM: 9 mg/dL (ref 8.7–10.2)
CO2: 27 mmol/L (ref 20–29)
Chloride: 100 mmol/L (ref 96–106)
Creatinine, Ser: 0.9 mg/dL (ref 0.57–1.00)
GFR, EST AFRICAN AMERICAN: 84 mL/min/{1.73_m2} (ref >59)
GFR, EST NON AFRICAN AMERICAN: 73 mL/min/{1.73_m2} (ref >59)
Glucose: 85 mg/dL (ref 65–99)
POTASSIUM: 4.1 mmol/L (ref 3.5–5.2)
Sodium: 140 mmol/L (ref 134–144)

## 2016-12-14 MED ORDER — METOPROLOL TARTRATE 50 MG PO TABS: 50.0000 mg | ORAL_TABLET | Freq: Once | ORAL | 0 refills | Status: DC

## 2016-12-14 NOTE — Telephone Encounter (Signed)
-----   Message from Jerline Pain, MD sent at 12/14/2016  1:14 PM EST ----- Regarding: RE: cardiac CT Agree, please give metoprolol 50 mg once prior to CT. Candee Furbish, MD  ----- Message ----- From: Shellia Cleverly, RN Sent: 12/13/2016   3:48 PM To: Jerline Pain, MD Subject: cardiac CT                                     Pt is scheduled for Cardiac CT 12/4.  Not on BB - do you want her to take Lopressor that morning?  Pam

## 2016-12-14 NOTE — Telephone Encounter (Signed)
Pt aware to take Metoprolol 50 mg once - one hr before her Cardiac CT scheduled for 12/4 at 8:30 am.  She has blood work today.  She has not further questions at this time.  She was grateful for the call, information and follow up.

## 2016-12-19 ENCOUNTER — Other Ambulatory Visit: Payer: Self-pay | Admitting: Family Medicine

## 2016-12-20 NOTE — Telephone Encounter (Signed)
Sent to PCP for approval.  

## 2016-12-28 ENCOUNTER — Ambulatory Visit (HOSPITAL_COMMUNITY)
Admission: RE | Admit: 2016-12-28 | Discharge: 2016-12-28 | Disposition: A | Discharge status: Home / Self Care | Payer: 59 | Source: Ambulatory Visit | Attending: Cardiology | Admitting: Cardiology

## 2016-12-28 DIAGNOSIS — R0789 Other chest pain: Secondary | ICD-10-CM | POA: Diagnosis not present

## 2016-12-28 DIAGNOSIS — I10 Essential (primary) hypertension: Secondary | ICD-10-CM | POA: Diagnosis not present

## 2016-12-28 DIAGNOSIS — Z1231 Encounter for screening mammogram for malignant neoplasm of breast: Secondary | ICD-10-CM | POA: Diagnosis not present

## 2016-12-28 DIAGNOSIS — Z8249 Family history of ischemic heart disease and other diseases of the circulatory system: Secondary | ICD-10-CM | POA: Diagnosis not present

## 2016-12-28 DIAGNOSIS — Z13820 Encounter for screening for osteoporosis: Secondary | ICD-10-CM | POA: Diagnosis not present

## 2016-12-28 DIAGNOSIS — Z01419 Encounter for gynecological examination (general) (routine) without abnormal findings: Secondary | ICD-10-CM | POA: Diagnosis not present

## 2016-12-28 DIAGNOSIS — Z6822 Body mass index (BMI) 22.0-22.9, adult: Secondary | ICD-10-CM | POA: Diagnosis not present

## 2016-12-28 MED ORDER — IOPAMIDOL (ISOVUE-370) INJECTION 76%
INTRAVENOUS | Status: AC
  Administered 2016-12-28: 80 mL via INTRAVENOUS
  Filled 2016-12-28: qty 100

## 2016-12-28 MED ORDER — NITROGLYCERIN 0.4 MG SL SUBL
SUBLINGUAL_TABLET | SUBLINGUAL | Status: AC
  Filled 2016-12-28: qty 2

## 2016-12-28 MED ORDER — NITROGLYCERIN 0.4 MG SL SUBL
0.8000 mg | SUBLINGUAL_TABLET | Freq: Once | SUBLINGUAL | Status: AC
  Administered 2016-12-28: 0.8 mg via SUBLINGUAL

## 2016-12-30 ENCOUNTER — Telehealth: Payer: Self-pay | Admitting: Cardiology

## 2016-12-30 NOTE — Telephone Encounter (Signed)
°  Follow Up  Calling to follow up on lab work. Please call.

## 2016-12-30 NOTE — Telephone Encounter (Signed)
Attempted to reach pt at # left.  NA and voicemail is full.  Released results to [facility].

## 2017-01-05 NOTE — Telephone Encounter (Signed)
Attempted to call pt however sounded as if someone picked up and then d/ced the call.  Will attempt again later.

## 2017-01-10 ENCOUNTER — Encounter: Payer: Self-pay | Admitting: *Deleted

## 2017-01-10 NOTE — Telephone Encounter (Signed)
Results mailed to patient's home address.

## 2017-03-02 ENCOUNTER — Other Ambulatory Visit: Payer: Self-pay | Admitting: Family Medicine

## 2017-03-02 NOTE — Telephone Encounter (Signed)
Change this to Temazepam 15 mg to take 2 qhs, call in #60 with 5 rf

## 2017-03-02 NOTE — Telephone Encounter (Signed)
Pt states she is out of the medication.

## 2017-03-02 NOTE — Telephone Encounter (Signed)
Sent to PCP ?

## 2017-03-03 ENCOUNTER — Other Ambulatory Visit: Payer: Self-pay

## 2017-03-03 MED ORDER — TEMAZEPAM 15 MG PO CAPS: 30.0000 mg | ORAL_CAPSULE | Freq: Every day | ORAL | 5 refills | Status: DC

## 2017-03-03 NOTE — Telephone Encounter (Signed)
Change this to Temazepam 15 mg to take 2 qhs, call in #60 with 5 rf  Called into pt's pharmacy

## 2017-03-03 NOTE — Telephone Encounter (Signed)
See my message.

## 2017-09-12 ENCOUNTER — Other Ambulatory Visit: Payer: Self-pay | Admitting: Family Medicine

## 2017-10-02 ENCOUNTER — Other Ambulatory Visit: Payer: Self-pay | Admitting: Family Medicine

## 2017-10-03 NOTE — Telephone Encounter (Signed)
Last OV 11/16/2016   Last refilled 11/10/2016 disp 90 with 1 refill   Sent to PCP for approval

## 2017-10-03 NOTE — Telephone Encounter (Signed)
Sent to PCP to advise 

## 2017-10-03 NOTE — Telephone Encounter (Signed)
Call in #60 with 5 rf 

## 2017-10-03 NOTE — Telephone Encounter (Signed)
The patient came by the office wanting to know if her Rx for temazepam (RESTORIL) 30 MG capsule   The patient ran out yesterday and needs them to be refilled today so she can sleep tonight.   Send to:  [id number] - [place], [place] - [address]. AT [place] #:  [phone]   The patient made an appointment for this Friday (10/07/17) for a Physical

## 2017-10-07 ENCOUNTER — Ambulatory Visit: Payer: 59 | Admitting: Family Medicine

## 2017-10-07 ENCOUNTER — Encounter: Payer: Self-pay | Admitting: Family Medicine

## 2017-10-07 VITALS — BP 110/64 | HR 104 | Temp 98.5°F | Ht 63.5 in | Wt 133.2 lb

## 2017-10-07 DIAGNOSIS — Z23 Encounter for immunization: Secondary | ICD-10-CM | POA: Diagnosis not present

## 2017-10-07 DIAGNOSIS — Z0000 Encounter for general adult medical examination without abnormal findings: Secondary | ICD-10-CM | POA: Diagnosis not present

## 2017-10-07 MED ORDER — SERTRALINE HCL 50 MG PO TABS: 50.0000 mg | ORAL_TABLET | Freq: Every day | ORAL | 3 refills | Status: DC

## 2017-10-07 MED ORDER — LISINOPRIL 10 MG PO TABS: 10.0000 mg | ORAL_TABLET | Freq: Every day | ORAL | 3 refills | Status: DC

## 2017-10-07 NOTE — Addendum Note (Signed)
Addended by: Myriam Forehand on: 10/07/2017 02:37 PM   Modules accepted: Orders

## 2017-10-07 NOTE — Progress Notes (Signed)
   Subjective:    Patient ID: Bethany Wade, female    DOB: 04-14-1960, 57 y.o.   MRN: [id number]  HPI Here for a well exam. She feels great.    Review of Systems  Constitutional: Negative.   HENT: Negative.   Eyes: Negative.   Respiratory: Negative.   Cardiovascular: Negative.   Gastrointestinal: Negative.   Genitourinary: Negative for decreased urine volume, difficulty urinating, dyspareunia, dysuria, enuresis, flank pain, frequency, hematuria, pelvic pain and urgency.  Musculoskeletal: Negative.   Skin: Negative.   Neurological: Negative.   Psychiatric/Behavioral: Negative.        Objective:   Physical Exam  Constitutional: She is oriented to person, place, and time. She appears well-developed and well-nourished. No distress.  HENT:  Head: Normocephalic and atraumatic.  Right Ear: External ear normal.  Left Ear: External ear normal.  Nose: Nose normal.  Mouth/Throat: Oropharynx is clear and moist. No oropharyngeal exudate.  Eyes: Pupils are equal, round, and reactive to light. Conjunctivae and EOM are normal. No scleral icterus.  Neck: Normal range of motion. Neck supple. No JVD present. No thyromegaly present.  Cardiovascular: Normal rate, regular rhythm, normal heart sounds and intact distal pulses. Exam reveals no gallop and no friction rub.  No murmur heard. Pulmonary/Chest: Effort normal and breath sounds normal. No respiratory distress. She has no wheezes. She has no rales. She exhibits no tenderness.  Abdominal: Soft. Bowel sounds are normal. She exhibits no distension and no mass. There is no tenderness. There is no rebound and no guarding.  Musculoskeletal: Normal range of motion. She exhibits no edema or tenderness.  Lymphadenopathy:    She has no cervical adenopathy.  Neurological: She is alert and oriented to person, place, and time. She has normal reflexes. She displays normal reflexes. No cranial nerve deficit. She exhibits normal muscle tone.  Coordination normal.  Skin: Skin is warm and dry. No rash noted. No erythema.  Psychiatric: She has a normal mood and affect. Her behavior is normal. Judgment and thought content normal.          Assessment & Plan:  Well exam. We discussed diet and exercise. Get fasting labs soon.  Alysia Penna, MD

## 2017-10-10 ENCOUNTER — Other Ambulatory Visit (INDEPENDENT_AMBULATORY_CARE_PROVIDER_SITE_OTHER): Payer: 59

## 2017-10-10 DIAGNOSIS — Z0000 Encounter for general adult medical examination without abnormal findings: Secondary | ICD-10-CM

## 2017-10-10 LAB — LIPID PANEL
CHOL/HDL RATIO: 3
CHOLESTEROL: 196 mg/dL (ref 0–200)
HDL: 70.8 mg/dL (ref >39.00)
LDL CALC: 111 mg/dL — AB (ref 0–99)
NonHDL: 124.77
TRIGLYCERIDES: 71 mg/dL (ref 0.0–149.0)
VLDL: 14.2 mg/dL (ref 0.0–40.0)

## 2017-10-10 LAB — CBC WITH DIFFERENTIAL/PLATELET
BASOS ABS: 0 10*3/uL (ref 0.0–0.1)
Basophils Relative: 0.3 % (ref 0.0–3.0)
EOS ABS: 0.1 10*3/uL (ref 0.0–0.7)
EOS PCT: 2.4 % (ref 0.0–5.0)
HCT: 38.7 % (ref 36.0–46.0)
HEMOGLOBIN: 13 g/dL (ref 12.0–15.0)
Lymphocytes Relative: 26.5 % (ref 12.0–46.0)
Lymphs Abs: 1.1 10*3/uL (ref 0.7–4.0)
MCHC: 33.5 g/dL (ref 30.0–36.0)
MCV: 85.9 fl (ref 78.0–100.0)
MONO ABS: 0.3 10*3/uL (ref 0.1–1.0)
Monocytes Relative: 6.5 % (ref 3.0–12.0)
NEUTROS PCT: 64.3 % (ref 43.0–77.0)
Neutro Abs: 2.6 10*3/uL (ref 1.4–7.7)
Platelets: 174 10*3/uL (ref 150.0–400.0)
RBC: 4.5 Mil/uL (ref 3.87–5.11)
RDW: 13.5 % (ref 11.5–15.5)
WBC: 4.1 10*3/uL (ref 4.0–10.5)

## 2017-10-10 LAB — POC URINALSYSI DIPSTICK (AUTOMATED)
Bilirubin, UA: NEGATIVE
Blood, UA: NEGATIVE
Glucose, UA: NEGATIVE
KETONES UA: NEGATIVE
LEUKOCYTES UA: NEGATIVE
Nitrite, UA: NEGATIVE
PH UA: 7 (ref 5.0–8.0)
PROTEIN UA: NEGATIVE
Spec Grav, UA: 1.01 (ref 1.010–1.025)
UROBILINOGEN UA: 0.2 U/dL

## 2017-10-10 LAB — TSH: TSH: 1.94 u[IU]/mL (ref 0.35–4.50)

## 2017-10-10 LAB — HEPATIC FUNCTION PANEL
ALBUMIN: 4.4 g/dL (ref 3.5–5.2)
ALT: 13 U/L (ref 0–35)
AST: 21 U/L (ref 0–37)
Alkaline Phosphatase: 70 U/L (ref 39–117)
Bilirubin, Direct: 0.1 mg/dL (ref 0.0–0.3)
TOTAL PROTEIN: 7.1 g/dL (ref 6.0–8.3)
Total Bilirubin: 0.6 mg/dL (ref 0.2–1.2)

## 2017-10-10 LAB — BASIC METABOLIC PANEL
BUN: 16 mg/dL (ref 6–23)
CHLORIDE: 101 meq/L (ref 96–112)
CO2: 32 meq/L (ref 19–32)
Calcium: 9.1 mg/dL (ref 8.4–10.5)
Creatinine, Ser: 0.75 mg/dL (ref 0.40–1.20)
GFR: 85.03 mL/min (ref >60.00)
Glucose, Bld: 80 mg/dL (ref 70–99)
POTASSIUM: 4.5 meq/L (ref 3.5–5.1)
Sodium: 138 mEq/L (ref 135–145)

## 2017-10-17 ENCOUNTER — Encounter: Payer: Self-pay | Admitting: *Deleted

## 2017-12-10 ENCOUNTER — Other Ambulatory Visit: Payer: Self-pay | Admitting: Family Medicine

## 2018-02-15 DIAGNOSIS — Z1231 Encounter for screening mammogram for malignant neoplasm of breast: Secondary | ICD-10-CM | POA: Diagnosis not present

## 2018-02-15 DIAGNOSIS — Z6823 Body mass index (BMI) 23.0-23.9, adult: Secondary | ICD-10-CM | POA: Diagnosis not present

## 2018-02-15 DIAGNOSIS — Z01419 Encounter for gynecological examination (general) (routine) without abnormal findings: Secondary | ICD-10-CM | POA: Diagnosis not present

## 2018-03-02 DIAGNOSIS — M545 Low back pain: Secondary | ICD-10-CM | POA: Diagnosis not present

## 2018-03-07 DIAGNOSIS — M2560 Stiffness of unspecified joint, not elsewhere classified: Secondary | ICD-10-CM | POA: Diagnosis not present

## 2018-03-07 DIAGNOSIS — R293 Abnormal posture: Secondary | ICD-10-CM | POA: Diagnosis not present

## 2018-03-07 DIAGNOSIS — M545 Low back pain: Secondary | ICD-10-CM | POA: Diagnosis not present

## 2018-03-08 DIAGNOSIS — R293 Abnormal posture: Secondary | ICD-10-CM | POA: Diagnosis not present

## 2018-03-08 DIAGNOSIS — M545 Low back pain: Secondary | ICD-10-CM | POA: Diagnosis not present

## 2018-03-08 DIAGNOSIS — M2560 Stiffness of unspecified joint, not elsewhere classified: Secondary | ICD-10-CM | POA: Diagnosis not present

## 2018-03-09 DIAGNOSIS — M545 Low back pain: Secondary | ICD-10-CM | POA: Diagnosis not present

## 2018-03-15 DIAGNOSIS — M545 Low back pain: Secondary | ICD-10-CM | POA: Diagnosis not present

## 2018-03-22 DIAGNOSIS — R293 Abnormal posture: Secondary | ICD-10-CM | POA: Diagnosis not present

## 2018-03-22 DIAGNOSIS — M545 Low back pain: Secondary | ICD-10-CM | POA: Diagnosis not present

## 2018-03-22 DIAGNOSIS — M2560 Stiffness of unspecified joint, not elsewhere classified: Secondary | ICD-10-CM | POA: Diagnosis not present

## 2018-03-24 DIAGNOSIS — M545 Low back pain: Secondary | ICD-10-CM | POA: Diagnosis not present

## 2018-03-24 DIAGNOSIS — M2560 Stiffness of unspecified joint, not elsewhere classified: Secondary | ICD-10-CM | POA: Diagnosis not present

## 2018-03-24 DIAGNOSIS — R293 Abnormal posture: Secondary | ICD-10-CM | POA: Diagnosis not present

## 2018-03-25 ENCOUNTER — Other Ambulatory Visit: Payer: Self-pay | Admitting: Family Medicine

## 2018-03-27 DIAGNOSIS — R293 Abnormal posture: Secondary | ICD-10-CM | POA: Diagnosis not present

## 2018-03-27 DIAGNOSIS — M2560 Stiffness of unspecified joint, not elsewhere classified: Secondary | ICD-10-CM | POA: Diagnosis not present

## 2018-03-27 DIAGNOSIS — M545 Low back pain: Secondary | ICD-10-CM | POA: Diagnosis not present

## 2018-03-28 DIAGNOSIS — M2560 Stiffness of unspecified joint, not elsewhere classified: Secondary | ICD-10-CM | POA: Diagnosis not present

## 2018-03-28 DIAGNOSIS — M545 Low back pain: Secondary | ICD-10-CM | POA: Diagnosis not present

## 2018-03-28 DIAGNOSIS — R293 Abnormal posture: Secondary | ICD-10-CM | POA: Diagnosis not present

## 2018-03-29 DIAGNOSIS — M2560 Stiffness of unspecified joint, not elsewhere classified: Secondary | ICD-10-CM | POA: Diagnosis not present

## 2018-03-29 DIAGNOSIS — R293 Abnormal posture: Secondary | ICD-10-CM | POA: Diagnosis not present

## 2018-03-29 DIAGNOSIS — M545 Low back pain: Secondary | ICD-10-CM | POA: Diagnosis not present

## 2018-03-29 NOTE — Telephone Encounter (Signed)
Dr. Fry please advise on refill of med   Thanks  

## 2018-03-29 NOTE — Telephone Encounter (Signed)
Call in #60 with 5 rf 

## 2018-03-29 NOTE — Telephone Encounter (Signed)
rx has been called to the pharmacy and left on the VM.

## 2018-03-30 ENCOUNTER — Ambulatory Visit: Payer: 59 | Admitting: Family Medicine

## 2018-03-30 ENCOUNTER — Encounter: Payer: Self-pay | Admitting: Family Medicine

## 2018-03-30 VITALS — BP 118/82 | HR 75 | Temp 98.2°F | Wt 137.1 lb

## 2018-03-30 DIAGNOSIS — I10 Essential (primary) hypertension: Secondary | ICD-10-CM | POA: Diagnosis not present

## 2018-03-30 DIAGNOSIS — M5441 Lumbago with sciatica, right side: Secondary | ICD-10-CM | POA: Diagnosis not present

## 2018-03-30 DIAGNOSIS — M545 Low back pain: Secondary | ICD-10-CM | POA: Diagnosis not present

## 2018-03-30 DIAGNOSIS — J069 Acute upper respiratory infection, unspecified: Secondary | ICD-10-CM

## 2018-03-30 MED ORDER — DICLOFENAC EPOLAMINE 1.3 % TD PTCH: 1.0000 | MEDICATED_PATCH | Freq: Two times a day (BID) | TRANSDERMAL | 3 refills | Status: DC

## 2018-03-30 NOTE — Progress Notes (Signed)
   Subjective:    Patient ID: Bethany Wade, female    DOB: 17-Oct-1960, 58 y.o.   MRN: [id number]  HPI Here for a few issues. First she describes a low back pain that radiated down the right leg a few weeks ago. No recent trauma. She saw a Curator, and now she feels much better. He gave her a few Flector patches to wear which were very helpful. She asks if we can give her some refills. Also she had cold symptoms last week with stuffy head and PND, and these are resolved now. However she still has some pressure in the right ear. No fever or ST or cough. Her BP has been stable.    Review of Systems  Constitutional: Negative.   HENT: Positive for congestion, ear pain and sinus pressure. Negative for sore throat.   Eyes: Negative.   Respiratory: Negative.   Cardiovascular: Negative.   Musculoskeletal: Positive for back pain.       Objective:   Physical Exam Constitutional:      Appearance: Normal appearance.  HENT:     Right Ear: Tympanic membrane and ear canal normal.     Left Ear: Tympanic membrane and ear canal normal.     Nose: Nose normal.     Mouth/Throat:     Pharynx: Oropharynx is clear.  Eyes:     Conjunctiva/sclera: Conjunctivae normal.  Cardiovascular:     Rate and Rhythm: Normal rate and regular rhythm.     Pulses: Normal pulses.     Heart sounds: Normal heart sounds.  Pulmonary:     Effort: Pulmonary effort is normal.     Breath sounds: Normal breath sounds.  Musculoskeletal:     Comments: The lower back is unremarkable, no tenderness and full ROM   Lymphadenopathy:     Cervical: No cervical adenopathy.  Neurological:     Mental Status: She is alert.           Assessment & Plan:  Her HTN is well controlled. Her back pain is improving but we wil send in some Diclofenac patches for her to use. She is getting over a viral URI with some congestion. I suggested she use Mucinex prn.  Alysia Penna, MD

## 2018-04-03 ENCOUNTER — Telehealth: Payer: Self-pay | Admitting: Family Medicine

## 2018-04-03 DIAGNOSIS — M545 Low back pain: Secondary | ICD-10-CM | POA: Diagnosis not present

## 2018-04-03 DIAGNOSIS — R293 Abnormal posture: Secondary | ICD-10-CM | POA: Diagnosis not present

## 2018-04-03 DIAGNOSIS — M2560 Stiffness of unspecified joint, not elsewhere classified: Secondary | ICD-10-CM | POA: Diagnosis not present

## 2018-04-03 MED ORDER — DICLOFENAC SODIUM 1 % TD GEL: TRANSDERMAL | 5 refills | Status: DC

## 2018-04-03 NOTE — Telephone Encounter (Signed)
Dr. Fry please advise. Thanks  

## 2018-04-03 NOTE — Telephone Encounter (Signed)
Called and spoke with pt and she is aware of change in med.

## 2018-04-03 NOTE — Telephone Encounter (Signed)
Copied from [place] [id number]. Topic: Quick Communication - Rx Refill/Question >> Apr 03, 2018 12:28 PM Margot Ables wrote: Medication: diclofenac (FLECTOR) 1.3 % PTCH  - pt states these were $600 - is there a generic or alternative? [place] Wednesday morning. Please advise.  Has the patient contacted their pharmacy? yes Preferred Pharmacy (with phone number or street name): [facility] [place], [place] [phone] (Phone) [phone] (Fax)

## 2018-04-03 NOTE — Telephone Encounter (Signed)
Cancel the Diclofenac patches and call in Diclofenac 1% TD gel to apply every 6 hours prn, 100 mg tube with 5 rf

## 2018-04-04 DIAGNOSIS — M2560 Stiffness of unspecified joint, not elsewhere classified: Secondary | ICD-10-CM | POA: Diagnosis not present

## 2018-04-04 DIAGNOSIS — R293 Abnormal posture: Secondary | ICD-10-CM | POA: Diagnosis not present

## 2018-04-04 DIAGNOSIS — M545 Low back pain: Secondary | ICD-10-CM | POA: Diagnosis not present

## 2018-06-05 ENCOUNTER — Encounter: Payer: Self-pay | Admitting: Internal Medicine

## 2018-10-06 ENCOUNTER — Other Ambulatory Visit: Payer: Self-pay | Admitting: Family Medicine

## 2018-10-06 NOTE — Telephone Encounter (Signed)
Last filled 03/29/2018 Last OV 03/30/2018  Ok to fill?

## 2018-10-06 NOTE — Telephone Encounter (Signed)
Medication Refill - Medication: temazepam (RESTORIL) 15 MG capsule    Has the patient contacted their pharmacy? Yes.   (Agent: If no, request that the patient contact the pharmacy for the refill.) (Agent: If yes, when and what did the pharmacy advise?)  Preferred Pharmacy (with phone number or street name):  [facility] [place], [address] AT [place] [phone] (Phone) [phone] (Fax)     Agent: Please be advised that RX refills may take up to 3 business days. We ask that you follow-up with your pharmacy.

## 2018-10-06 NOTE — Telephone Encounter (Signed)
Requested medication (s) are due for refill today: yes  Requested medication (s) are on the active medication list:yes  Last refill:  03/25/2018  Future visit scheduled: yes  Notes to clinic: refill cannot be delegated    Requested Prescriptions  Pending Prescriptions Disp Refills   temazepam (RESTORIL) 15 MG capsule 60 capsule 5    Sig: Take 2 capsules (30 mg total) by mouth at bedtime.     Not Delegated - Psychiatry:  Anxiolytics/Hypnotics Failed - 10/06/2018  9:17 AM      Failed - This refill cannot be delegated      Failed - Urine Drug Screen completed in last 360 days.      Failed - Valid encounter within last 6 months    Recent Outpatient Visits          6 months ago Acute right-sided low back pain with right-sided sciatica   Therapist, music at [place], [place]   12 months ago Preventative health care   [employer] [facility] [place], [place]   1 year ago Chest pain, unspecified type   [employer] at [facility], Ishmael Holter, MD   1 year ago Baker's cyst of knee, left   Therapist, music at [place], MD   2 years ago Laceration of scalp, subsequent Electronics [facility] at [facility], Ishmael Holter, MD

## 2018-10-09 ENCOUNTER — Other Ambulatory Visit: Payer: Self-pay

## 2018-10-09 ENCOUNTER — Encounter: Payer: Self-pay | Admitting: Family Medicine

## 2018-10-09 ENCOUNTER — Telehealth (INDEPENDENT_AMBULATORY_CARE_PROVIDER_SITE_OTHER): Payer: 59 | Admitting: Family Medicine

## 2018-10-09 DIAGNOSIS — M79642 Pain in left hand: Secondary | ICD-10-CM

## 2018-10-09 DIAGNOSIS — M25561 Pain in right knee: Secondary | ICD-10-CM

## 2018-10-09 DIAGNOSIS — G8929 Other chronic pain: Secondary | ICD-10-CM

## 2018-10-09 MED ORDER — SERTRALINE HCL 50 MG PO TABS
50.0000 mg | ORAL_TABLET | Freq: Every day | ORAL | 3 refills | Status: DC
Start: 2018-10-09 — End: 2019-11-02

## 2018-10-09 MED ORDER — TEMAZEPAM 15 MG PO CAPS: 30.0000 mg | ORAL_CAPSULE | Freq: Every day | ORAL | 5 refills | Status: DC

## 2018-10-09 MED ORDER — LISINOPRIL 10 MG PO TABS
10.0000 mg | ORAL_TABLET | Freq: Every day | ORAL | 3 refills | Status: DC
Start: 2018-10-09 — End: 2019-09-04

## 2018-10-09 NOTE — Progress Notes (Signed)
Virtual Visit via Video Note  I connected with the patient on 10/09/18 at  1:45 PM EDT by a video enabled telemedicine application and verified that I am speaking with the correct person using two identifiers.  Location patient: home Location provider:work or home office Persons participating in the virtual visit: patient, provider  I discussed the limitations of evaluation and management by telemedicine and the availability of in person appointments. The patient expressed understanding and agreed to proceed.   HPI: Here complaining of pain in the left hand and in the right knee. These started a few months ago, but the hand pain has gotten worse. She also notes some swelling in the left hand. She is left handed. She has had to give up tennis because of the pain. Ibuprofen helps a little.    ROS: See pertinent positives and negatives per HPI.  Past Medical History:  Diagnosis Date  . Anxiety   . GERD (gastroesophageal reflux disease)   . Hypertension   . Insomnia     Past Surgical History:  Procedure Laterality Date  . APPENDECTOMY    . CESAREAN SECTION     x2  . COLONOSCOPY  05-10-13   per Dr. Henrene Pastor, sessile serrated polyp, repeat in 5 yrs     Family History  Problem Relation Age of Onset  . Heart attack Father 7       mi; several brothers with SCD  . Diabetes Father   . Hyperlipidemia Father   . Sudden death Father   . Heart disease Father   . Hypertension Father   . Heart attack Sister 75  . Hyperlipidemia Sister   . Coronary artery disease Unknown        fm hx  . Diabetes Unknown        fm hx  . Hyperlipidemia Unknown        fm hx  . Hypertension Unknown        fm hx  . Stroke Unknown        <60 fm hx  . Diabetes Mother   . Hyperlipidemia Mother   . Heart disease Mother   . Hypertension Mother   . Hyperlipidemia Brother   . Stroke Brother 38  . Diabetes Paternal Aunt   . Heart attack Paternal Aunt   . Hyperlipidemia Paternal Aunt   . Hypertension  Paternal Aunt   . Sudden death Paternal Aunt   . Diabetes Paternal Uncle   . Heart attack Paternal Uncle   . Hyperlipidemia Paternal Uncle   . Hypertension Paternal Uncle   . Sudden death Paternal Uncle   . Diabetes Maternal Grandfather   . Sudden death Maternal Grandfather   . Hypertension Maternal Grandfather   . Hyperlipidemia Maternal Grandfather   . Heart attack Maternal Grandfather   . Diabetes Paternal Grandmother   . Sudden death Paternal Grandmother   . Hyperlipidemia Paternal Grandmother   . Heart attack Paternal Grandmother   . Hypertension Paternal Grandmother   . Diabetes Paternal Grandfather   . Sudden death Paternal Grandfather   . Heart attack Paternal Grandfather   . Hyperlipidemia Paternal Grandfather   . Hypertension Paternal Grandfather   . Colon cancer Neg Hx   . Esophageal cancer Neg Hx   . Rectal cancer Neg Hx   . Stomach cancer Neg Hx      Current Outpatient Medications:  .  acetaminophen (TYLENOL) 500 MG tablet, Take 500 mg by mouth every 6 (six) hours as needed for moderate pain., Disp: ,  Rfl:  .  aspirin 81 MG EC tablet, Take 81 mg by mouth daily.  , Disp: , Rfl:  .  aspirin-acetaminophen-caffeine (EXCEDRIN MIGRAINE) 250-250-65 MG tablet, Take 1 tablet by mouth every 6 (six) hours as needed for headache., Disp: , Rfl:  .  diclofenac (FLECTOR) 1.3 % PTCH, Place 1 patch onto the skin 2 (two) times daily., Disp: 60 patch, Rfl: 3 .  diclofenac sodium (VOLTAREN) 1 % GEL, Apply every 6 hours as needed, Disp: 100 g, Rfl: 5 .  lisinopril (ZESTRIL) 10 MG tablet, Take 1 tablet (10 mg total) by mouth daily., Disp: 90 tablet, Rfl: 3 .  magnesium gluconate (MAGONATE) 500 MG tablet, Take 500 mg by mouth daily., Disp: , Rfl:  .  Menthol-Camphor (TIGER BALM ARTHRITIS RUB EX), Apply 1 application topically as needed (muscle pain)., Disp: , Rfl:  .  Multiple Vitamins-Minerals (MULTIVITAMIN PO), Take by mouth daily., Disp: , Rfl:  .  sertraline (ZOLOFT) 50 MG tablet,  Take 1 tablet (50 mg total) by mouth daily., Disp: 90 tablet, Rfl: 3 .  temazepam (RESTORIL) 15 MG capsule, Take 2 capsules (30 mg total) by mouth at bedtime., Disp: 60 capsule, Rfl: 5  EXAM:  VITALS per patient if applicable:  GENERAL: alert, oriented, appears well and in no acute distress  HEENT: atraumatic, conjunttiva clear, no obvious abnormalities on inspection of external nose and ears  NECK: normal movements of the head and neck  LUNGS: on inspection no signs of respiratory distress, breathing rate appears normal, no obvious gross SOB, gasping or wheezing  CV: no obvious cyanosis  MS: moves all visible extremities without noticeable abnormality  PSYCH/NEURO: pleasant and cooperative, no obvious depression or anxiety, speech and thought processing grossly intact  ASSESSMENT AND PLAN: Left hand and right knee pain. She already has a supply of Diclofenac gel at home, so I suggested she apply this bid to these areas. Recheck prn.  Alysia Penna, MD  Discussed the following assessment and plan:  No diagnosis found.     I discussed the assessment and treatment plan with the patient. The patient was provided an opportunity to ask questions and all were answered. The patient agreed with the plan and demonstrated an understanding of the instructions.   The patient was advised to call back or seek an in-person evaluation if the symptoms worsen or if the condition fails to improve as anticipated.

## 2019-02-05 ENCOUNTER — Ambulatory Visit: Payer: 59 | Attending: Internal Medicine

## 2019-02-05 DIAGNOSIS — Z20822 Contact with and (suspected) exposure to covid-19: Secondary | ICD-10-CM

## 2019-02-06 LAB — NOVEL CORONAVIRUS, NAA: SARS-CoV-2, NAA: NOT DETECTED

## 2019-02-15 ENCOUNTER — Other Ambulatory Visit: Payer: Self-pay

## 2019-02-16 ENCOUNTER — Encounter: Payer: Self-pay | Admitting: Family Medicine

## 2019-02-16 ENCOUNTER — Ambulatory Visit (INDEPENDENT_AMBULATORY_CARE_PROVIDER_SITE_OTHER): Payer: 59 | Admitting: Family Medicine

## 2019-02-16 VITALS — BP 120/62 | HR 65 | Temp 98.0°F | Wt 132.8 lb

## 2019-02-16 DIAGNOSIS — Z23 Encounter for immunization: Secondary | ICD-10-CM

## 2019-02-16 DIAGNOSIS — Z0000 Encounter for general adult medical examination without abnormal findings: Secondary | ICD-10-CM | POA: Diagnosis not present

## 2019-02-16 LAB — BASIC METABOLIC PANEL
BUN: 14 mg/dL (ref 6–23)
CO2: 30 mEq/L (ref 19–32)
Calcium: 9.2 mg/dL (ref 8.4–10.5)
Chloride: 102 mEq/L (ref 96–112)
Creatinine, Ser: 0.69 mg/dL (ref 0.40–1.20)
GFR: 87.65 mL/min (ref >60.00)
Glucose, Bld: 92 mg/dL (ref 70–99)
Potassium: 4.4 mEq/L (ref 3.5–5.1)
Sodium: 138 mEq/L (ref 135–145)

## 2019-02-16 LAB — CBC WITH DIFFERENTIAL/PLATELET
Basophils Absolute: 0 10*3/uL (ref 0.0–0.1)
Basophils Relative: 0.4 % (ref 0.0–3.0)
Eosinophils Absolute: 0.2 10*3/uL (ref 0.0–0.7)
Eosinophils Relative: 4.3 % (ref 0.0–5.0)
HCT: 40.4 % (ref 36.0–46.0)
Hemoglobin: 13.2 g/dL (ref 12.0–15.0)
Lymphocytes Relative: 37.9 % (ref 12.0–46.0)
Lymphs Abs: 1.9 10*3/uL (ref 0.7–4.0)
MCHC: 32.7 g/dL (ref 30.0–36.0)
MCV: 87.4 fl (ref 78.0–100.0)
Monocytes Absolute: 0.3 10*3/uL (ref 0.1–1.0)
Monocytes Relative: 5.6 % (ref 3.0–12.0)
Neutro Abs: 2.5 10*3/uL (ref 1.4–7.7)
Neutrophils Relative %: 51.8 % (ref 43.0–77.0)
Platelets: 192 10*3/uL (ref 150.0–400.0)
RBC: 4.63 Mil/uL (ref 3.87–5.11)
RDW: 14.1 % (ref 11.5–15.5)
WBC: 4.9 10*3/uL (ref 4.0–10.5)

## 2019-02-16 LAB — LIPID PANEL
Cholesterol: 213 mg/dL — ABNORMAL HIGH (ref 0–200)
HDL: 75.3 mg/dL (ref >39.00)
LDL Cholesterol: 124 mg/dL — ABNORMAL HIGH (ref 0–99)
NonHDL: 137.41
Total CHOL/HDL Ratio: 3
Triglycerides: 69 mg/dL (ref 0.0–149.0)
VLDL: 13.8 mg/dL (ref 0.0–40.0)

## 2019-02-16 LAB — HEPATIC FUNCTION PANEL
ALT: 15 U/L (ref 0–35)
AST: 21 U/L (ref 0–37)
Albumin: 4.4 g/dL (ref 3.5–5.2)
Alkaline Phosphatase: 73 U/L (ref 39–117)
Bilirubin, Direct: 0.1 mg/dL (ref 0.0–0.3)
Total Bilirubin: 0.6 mg/dL (ref 0.2–1.2)
Total Protein: 7.1 g/dL (ref 6.0–8.3)

## 2019-02-16 LAB — TSH: TSH: 1.7 u[IU]/mL (ref 0.35–4.50)

## 2019-02-16 NOTE — Progress Notes (Signed)
   Subjective:    Patient ID: Bethany Wade, female    DOB: 07-21-1960, 59 y.o.   MRN: [id number]  HPI Here for a well exam. She feels great and has no concerns. She will see her GYN soon for an exam, mammogram, and DEXA.    Review of Systems  Constitutional: Negative.   HENT: Negative.   Eyes: Negative.   Respiratory: Negative.   Cardiovascular: Negative.   Gastrointestinal: Negative.   Genitourinary: Negative for decreased urine volume, difficulty urinating, dyspareunia, dysuria, enuresis, flank pain, frequency, hematuria, pelvic pain and urgency.  Musculoskeletal: Negative.   Skin: Negative.   Neurological: Negative.   Psychiatric/Behavioral: Negative.        Objective:   Physical Exam Constitutional:      General: She is not in acute distress.    Appearance: She is well-developed.  HENT:     Head: Normocephalic and atraumatic.     Right Ear: External ear normal.     Left Ear: External ear normal.     Nose: Nose normal.     Mouth/Throat:     Pharynx: No oropharyngeal exudate.  Eyes:     General: No scleral icterus.    Conjunctiva/sclera: Conjunctivae normal.     Pupils: Pupils are equal, round, and reactive to light.  Neck:     Thyroid: No thyromegaly.     Vascular: No JVD.  Cardiovascular:     Rate and Rhythm: Normal rate and regular rhythm.     Heart sounds: Normal heart sounds. No murmur. No friction rub. No gallop.   Pulmonary:     Effort: Pulmonary effort is normal. No respiratory distress.     Breath sounds: Normal breath sounds. No wheezing or rales.  Chest:     Chest wall: No tenderness.  Abdominal:     General: Bowel sounds are normal. There is no distension.     Palpations: Abdomen is soft. There is no mass.     Tenderness: There is no abdominal tenderness. There is no guarding or rebound.  Musculoskeletal:        General: No tenderness. Normal range of motion.     Cervical back: Normal range of motion and neck supple.  Lymphadenopathy:   Cervical: No cervical adenopathy.  Skin:    General: Skin is warm and dry.     Findings: No erythema or rash.  Neurological:     Mental Status: She is alert and oriented to person, place, and time.     Cranial Nerves: No cranial nerve deficit.     Motor: No abnormal muscle tone.     Coordination: Coordination normal.     Deep Tendon Reflexes: Reflexes are normal and symmetric. Reflexes normal.  Psychiatric:        Behavior: Behavior normal.        Thought Content: Thought content normal.        Judgment: Judgment normal.           Assessment & Plan:  Well exam. We discussed diet and exercise. Get fasting labs.  Alysia Penna, MD

## 2019-03-30 ENCOUNTER — Encounter: Payer: Self-pay | Admitting: Family Medicine

## 2019-04-03 ENCOUNTER — Other Ambulatory Visit: Payer: Self-pay | Admitting: Family Medicine

## 2019-04-03 NOTE — Telephone Encounter (Signed)
Last filled 10/09/2018 Last OV 02/16/2019  Ok to fill?

## 2019-09-02 ENCOUNTER — Other Ambulatory Visit: Payer: Self-pay | Admitting: Family Medicine

## 2019-10-02 ENCOUNTER — Other Ambulatory Visit: Payer: Self-pay | Admitting: Family Medicine

## 2019-10-02 NOTE — Telephone Encounter (Signed)
Last filled 04/03/2019 Last OV 02/16/2019  Ok to fill?

## 2019-10-03 ENCOUNTER — Telehealth: Payer: Self-pay | Admitting: Family Medicine

## 2019-10-03 NOTE — Telephone Encounter (Signed)
Pt is calling for a refill on her medication   temazepam (RESTORIL) 15 MG capsule   [id number] [address], [address] AT [address], [place] [address]  Phone:  [phone] Fax:  [phone]   Please advise

## 2019-10-03 NOTE — Telephone Encounter (Signed)
Last filled 04/03/2019 Last OV 02/16/2019  Ok to fill?

## 2019-10-04 NOTE — Telephone Encounter (Signed)
Done yesterday.

## 2019-10-05 ENCOUNTER — Encounter: Payer: Self-pay | Admitting: Family Medicine

## 2019-10-05 ENCOUNTER — Ambulatory Visit (INDEPENDENT_AMBULATORY_CARE_PROVIDER_SITE_OTHER): Payer: 59 | Admitting: Family Medicine

## 2019-10-05 ENCOUNTER — Ambulatory Visit: Payer: Self-pay

## 2019-10-05 ENCOUNTER — Other Ambulatory Visit: Payer: Self-pay

## 2019-10-05 DIAGNOSIS — M25532 Pain in left wrist: Secondary | ICD-10-CM | POA: Diagnosis not present

## 2019-10-05 DIAGNOSIS — M25562 Pain in left knee: Secondary | ICD-10-CM

## 2019-10-05 MED ORDER — NABUMETONE 750 MG PO TABS: 750.0000 mg | ORAL_TABLET | Freq: Two times a day (BID) | ORAL | 6 refills | Status: DC | PRN

## 2019-10-05 NOTE — Progress Notes (Signed)
Office Visit Note   Patient: Bethany Wade           Date of Birth: 1960-07-22           MRN: [id number] Visit Date: 10/05/2019 Requested by: Laurey Morale, MD [place],  [place] [address] PCP: Laurey Morale, MD  Subjective: Chief Complaint  Patient presents with  . Left Knee - Pain, Injury    HPI: She is here with left wrist and left knee pain.  Yesterday playing tennis she planted and pivoted, fell to the ground, felt immediate pain in her left knee.  It swelled quickly.  She has been able to bear weight but having difficulty fully extending her knee.  Her left wrist has been bothering her for about a week mostly on the dorsal ulnar aspect but it was injured when she fell.              ROS:   All other systems were reviewed and are negative.  Objective: Vital Signs: There were no vitals taken for this visit.  Physical Exam:  General:  Alert and oriented, in no acute distress. Pulm:  Breathing unlabored. Psy:  Normal mood, congruent affect. Skin: There is some bruising around both knees. Left wrist: Full active range of motion with no effusion.  She has some tenderness on the dorsal aspect of her wrist, ulnar side.  No pain with resisted strength testing.  No subluxation of the ECU tendon. Left knee: 1-2+ effusion with no warmth.  Negative patella apprehension test, solid Lachman's.  Tender across the joint lines.   Imaging: XR Knee 1-2 Views Left  Result Date: 10/05/2019 X-rays of the left knee reveal early lateral compartment spurring.  No acute fracture seen, no loose body.  Right knee has similar lateral degenerative changes.  XR Wrist 2 Views Left  Result Date: 10/05/2019 X-rays of left wrist reveal anatomic alignment.  She has some degenerative changes near the distal capitate on the lateral view.  No sign of acute fracture.   Assessment & Plan: 1.  Left knee pain with effusion, cannot rule out degenerative meniscus tear. -Ice,  anti-inflammatories, hinged knee brace for support, relative rest until swelling is down.  If not improved by next week, we will aspirate and possibly inject.  2.  Left wrist early DJD -Anti-inflammatories as needed.  Glucosamine sulfate, turmeric.     Procedures: No procedures performed  No notes on file     PMFS History: Patient Active Problem List   Diagnosis Date Noted  . HTN (hypertension) 02/13/2014  . GERD (gastroesophageal reflux disease) 03/07/2013  . Other and unspecified hyperlipidemia 07/25/2012  . Left shoulder pain 12/14/2010  . Left leg pain 12/01/2010  . MIGRAINE HEADACHE 06/06/2009  . TALIPES CAVUS 04/28/2009  . WRIST PAIN 03/25/2009  . GANGLION CYST, WRIST, RIGHT 03/25/2009  . TOE PAIN 03/25/2009  . BACTERIURIA 10/08/2008  . LIVER FUNCTION TESTS, ABNORMAL, HX OF 10/08/2008  . INSOMNIA 10/16/2007  . ACUTE SINUSITIS, UNSPECIFIED 02/21/2007  . Anxiety state 08/16/2006  . DIZZINESS 08/16/2006  . POSITIVE PPD 08/16/2006  . COLONIC POLYPS, BENIGN, HX OF 08/16/2006   Past Medical History:  Diagnosis Date  . Anxiety   . GERD (gastroesophageal reflux disease)   . Hypertension   . Insomnia     Family History  Problem Relation Age of Onset  . Heart attack Father 70       mi; several brothers with SCD  . Diabetes Father   .  Hyperlipidemia Father   . Sudden death Father   . Heart disease Father   . Hypertension Father   . Heart attack Sister 55  . Hyperlipidemia Sister   . Coronary artery disease Other        fm hx  . Diabetes Other        fm hx  . Hyperlipidemia Other        fm hx  . Hypertension Other        fm hx  . Stroke Other        <60 fm hx  . Diabetes Mother   . Hyperlipidemia Mother   . Heart disease Mother   . Hypertension Mother   . Hyperlipidemia Brother   . Stroke Brother 30  . Diabetes Paternal Aunt   . Heart attack Paternal Aunt   . Hyperlipidemia Paternal Aunt   . Hypertension Paternal Aunt   . Sudden death Paternal Aunt    . Diabetes Paternal Uncle   . Heart attack Paternal Uncle   . Hyperlipidemia Paternal Uncle   . Hypertension Paternal Uncle   . Sudden death Paternal Uncle   . Diabetes Maternal Grandfather   . Sudden death Maternal Grandfather   . Hypertension Maternal Grandfather   . Hyperlipidemia Maternal Grandfather   . Heart attack Maternal Grandfather   . Diabetes Paternal Grandmother   . Sudden death Paternal Grandmother   . Hyperlipidemia Paternal Grandmother   . Heart attack Paternal Grandmother   . Hypertension Paternal Grandmother   . Diabetes Paternal Grandfather   . Sudden death Paternal Grandfather   . Heart attack Paternal Grandfather   . Hyperlipidemia Paternal Grandfather   . Hypertension Paternal Grandfather   . Colon cancer Neg Hx   . Esophageal cancer Neg Hx   . Rectal cancer Neg Hx   . Stomach cancer Neg Hx     Past Surgical History:  Procedure Laterality Date  . APPENDECTOMY    . CESAREAN SECTION     x2  . COLONOSCOPY  05-10-13   per Dr. Henrene Pastor, sessile serrated polyp, repeat in 5 yrs    Social History   Occupational History  . Not on file  Tobacco Use  . Smoking status: Never Smoker  . Smokeless tobacco: Never Used  Vaping Use  . Vaping Use: Never used  Substance and Sexual Activity  . Alcohol use: Yes    Alcohol/week: 7.0 standard drinks    Types: 7 Glasses of wine per week  . Drug use: No  . Sexual activity: Not on file

## 2019-10-05 NOTE — Patient Instructions (Signed)
    Knee:  - Glucosamine sulfate 1,000 mg twice daily  - Turmeric 500 mg twice daily  - Ice 15-20 minutes, 2-3 times daily until swelling is better  - Voltaren gel 4 times daily as needed  - If not better Monday, we will drain the knee  - I called in nabumetone (anti-inflammatory) pills to take as needed

## 2019-10-22 ENCOUNTER — Other Ambulatory Visit: Payer: Self-pay

## 2019-10-22 DIAGNOSIS — M25562 Pain in left knee: Secondary | ICD-10-CM

## 2019-10-22 NOTE — Progress Notes (Signed)
Patient having continued pain with left knee. MRI of left knee submitted

## 2019-10-22 NOTE — Progress Notes (Signed)
Thank you for ordering that.  I evaluated Bethany Wade 2 weeks ago when she injured it in [place].  At that time she had a effusion as well as lateral joint line tenderness and ACL laxity.  Collaterals were stable.  Pedal pulses were intact.  She describes continued symptomatic instability on a daily basis.  She wants to return to playing tennis and likely has meniscal pathology in addition to ACL tear.  She has had a trial of nonoperative treatment with bracing and activity modification but she continues to have symptomatic instability with activities of daily living and therefore MRI scan is indicated.  She should follow up afterwards.  Thank you

## 2019-10-26 ENCOUNTER — Telehealth: Payer: Self-pay | Admitting: Orthopedic Surgery

## 2019-10-26 NOTE — Telephone Encounter (Signed)
Are you willing to call with results?

## 2019-10-26 NOTE — Telephone Encounter (Signed)
Patient called advised she will be going out of town from 11/03/2019 to 11/12/2019 and want to know the MRI results can be called to her? The number to contact patient is [phone]

## 2019-10-27 ENCOUNTER — Other Ambulatory Visit: Payer: Self-pay

## 2019-10-27 ENCOUNTER — Ambulatory Visit
Admission: RE | Admit: 2019-10-27 | Discharge: 2019-10-27 | Disposition: A | Discharge status: Home / Self Care | Payer: 59 | Source: Ambulatory Visit | Attending: Orthopedic Surgery | Admitting: Orthopedic Surgery

## 2019-10-27 DIAGNOSIS — M25562 Pain in left knee: Secondary | ICD-10-CM

## 2019-10-27 NOTE — Telephone Encounter (Signed)
Yes when is scan?

## 2019-10-29 NOTE — Telephone Encounter (Signed)
Scan done on Saturday 10/02

## 2019-10-29 NOTE — Progress Notes (Signed)
Hi [name] I talked to [name].  I left a prescription for her to go see Art for some prerehabilitation.  Can you also include the MRI report when you fax over the prescription.  Also, can you make her an appointment to see me in about 2 weeks.  I think that she will see our before then.  Thanks

## 2019-10-30 NOTE — Telephone Encounter (Signed)
I called and reviewed the scan with her.  Can you get her set up for prerehabilitation with arch [facility] and then have her see me in 2 weeks that would be great thanks

## 2019-11-01 ENCOUNTER — Other Ambulatory Visit: Payer: Self-pay

## 2019-11-01 DIAGNOSIS — M25562 Pain in left knee: Secondary | ICD-10-CM

## 2019-11-02 ENCOUNTER — Other Ambulatory Visit: Payer: Self-pay

## 2019-11-02 ENCOUNTER — Ambulatory Visit: Payer: 59 | Admitting: Family Medicine

## 2019-11-02 ENCOUNTER — Encounter: Payer: Self-pay | Admitting: Family Medicine

## 2019-11-02 VITALS — BP 110/80 | HR 71 | Temp 98.5°F | Ht 63.5 in | Wt 132.6 lb

## 2019-11-02 DIAGNOSIS — F411 Generalized anxiety disorder: Secondary | ICD-10-CM

## 2019-11-02 DIAGNOSIS — G4700 Insomnia, unspecified: Secondary | ICD-10-CM | POA: Diagnosis not present

## 2019-11-02 DIAGNOSIS — Z23 Encounter for immunization: Secondary | ICD-10-CM | POA: Diagnosis not present

## 2019-11-02 DIAGNOSIS — I10 Essential (primary) hypertension: Secondary | ICD-10-CM | POA: Diagnosis not present

## 2019-11-02 MED ORDER — SERTRALINE HCL 50 MG PO TABS
50.0000 mg | ORAL_TABLET | Freq: Every day | ORAL | 3 refills | Status: DC
Start: 2019-11-02 — End: 2020-04-09

## 2019-11-02 NOTE — Addendum Note (Signed)
Addended by: Matilde Sprang on: 11/02/2019 05:25 PM   Modules accepted: Orders

## 2019-11-02 NOTE — Progress Notes (Signed)
° °  Subjective:    Patient ID: Bethany Wade, female    DOB: 07-28-1960, 59 y.o.   MRN: [id number]  HPI Here to follow up. In general she has been doing well. She is sleeping well and her anxiety is well controlled. She fell a few months ago and she tore her left ACL and left medial meniscus. She plans to have these repaired by Dr. Alphonzo Severance later this month.    Review of Systems  Constitutional: Negative.   Respiratory: Negative.   Cardiovascular: Negative.        Objective:   Physical Exam Constitutional:      Appearance: Normal appearance.  Cardiovascular:     Rate and Rhythm: Normal rate and regular rhythm.     Pulses: Normal pulses.     Heart sounds: Normal heart sounds.  Pulmonary:     Effort: Pulmonary effort is normal.     Breath sounds: Normal breath sounds.  Neurological:     Mental Status: She is alert.           Assessment & Plan:  Her HTN and anxiety and insomnia are stable. Meds are refilled.  Alysia Penna, MD

## 2019-11-15 ENCOUNTER — Encounter (HOSPITAL_BASED_OUTPATIENT_CLINIC_OR_DEPARTMENT_OTHER): Payer: Self-pay | Admitting: Orthopedic Surgery

## 2019-11-15 ENCOUNTER — Ambulatory Visit: Payer: 59 | Admitting: Orthopedic Surgery

## 2019-11-15 ENCOUNTER — Other Ambulatory Visit: Payer: Self-pay

## 2019-11-15 DIAGNOSIS — S83512D Sprain of anterior cruciate ligament of left knee, subsequent encounter: Secondary | ICD-10-CM

## 2019-11-18 ENCOUNTER — Encounter: Payer: Self-pay | Admitting: Orthopedic Surgery

## 2019-11-18 NOTE — Progress Notes (Signed)
Office Visit Note   Patient: Bethany Wade           Date of Birth: 1960-07-27           MRN: [id number] Visit Date: 11/15/2019 Requested by: Laurey Morale, MD [place],  [place] [address] PCP: Laurey Morale, MD  Subjective: Chief Complaint  Patient presents with  . Left Knee - Pain    HPI: Bethany Wade is a 59 year old patient with left knee pain.  She sustained an injury to the left knee a month ago playing a tennis tournament in [place].  She reports symptomatic instability since that time.  MRI scan of that left knee shows ACL tear and medial meniscal tear.  She wants to get back to an active lifestyle.  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  No personal or family history of DVT or pulmonary embolism              ROS: All systems reviewed are negative as they relate to the chief complaint within the history of present illness.  Patient denies  fevers or chills.   Assessment & Plan: Visit Diagnoses:  1. Rupture of anterior cruciate ligament of left knee, subsequent encounter     Plan: Impression is left knee pain with symptomatic instability ACL tear and medial meniscal tear.  Plan is ACL reconstruction using hamstring autograft.  Also plan to do meniscal debridement and/or repair.  Risks and benefits are discussed with the patient including not limited to infection nerve vessel damage incomplete healing and restoration of function as well as knee stiffness.  Extensive nature of the rehabilitative process discussed.  All questions answered.  Continue physical therapy for prerehabilitation in order to achieve maximal range of motion and strength prior to surgery.  Anticipate that we may have to use both some IT and gracilis to achieve a 9 to 9.5 mm graft.  Follow-Up Instructions: No follow-ups on file.   Orders:  No orders of the defined types were placed in this  encounter.  No orders of the defined types were placed in this encounter.     Procedures: No procedures performed   Clinical Data: No additional findings.  Objective: Vital Signs: There were no vitals taken for this visit.  Physical Exam:   Constitutional: Patient appears well-developed HEENT:  Head: Normocephalic Eyes:EOM are normal Neck: Normal range of motion Cardiovascular: Normal rate Pulmonary/chest: Effort normal Neurologic: Patient is alert Skin: Skin is warm Psychiatric: Patient has normal mood and affect    Ortho Exam: Ortho exam demonstrates about 4 degrees of hyperextension in the right knee.  Left knee lacks about 2 degrees of full extension.  Mild effusion is present.  Flexion is to 115 on the left 130 on the right.  ACL laxity is present on the left.  No posterior lateral rotatory instability.  Collateral ligaments are stable to varus valgus stress at 0 and 30 degrees.  Pedal pulse palpable.  Ankle dorsiflexion intact.  Specialty Comments:  No specialty comments available.  Imaging: No results found.   PMFS History: Patient Active Problem List   Diagnosis Date Noted  . HTN (hypertension) 02/13/2014  . GERD (gastroesophageal reflux disease) 03/07/2013  . Other and unspecified hyperlipidemia 07/25/2012  . Left shoulder pain 12/14/2010  . Left leg pain 12/01/2010  . MIGRAINE HEADACHE 06/06/2009  . TALIPES CAVUS 04/28/2009  . GANGLION CYST, WRIST, RIGHT 03/25/2009  .  LIVER FUNCTION TESTS, ABNORMAL, HX OF 10/08/2008  . INSOMNIA 10/16/2007  . Anxiety state 08/16/2006  . POSITIVE PPD 08/16/2006  . COLONIC POLYPS, BENIGN, HX OF 08/16/2006   Past Medical History:  Diagnosis Date  . Anxiety   . GERD (gastroesophageal reflux disease)   . Hypertension   . Insomnia     Family History  Problem Relation Age of Onset  . Heart attack Father 70       mi; several brothers with SCD  . Diabetes Father   . Hyperlipidemia Father   . Sudden death Father     . Heart disease Father   . Hypertension Father   . Heart attack Sister 13  . Hyperlipidemia Sister   . Coronary artery disease Other        fm hx  . Diabetes Other        fm hx  . Hyperlipidemia Other        fm hx  . Hypertension Other        fm hx  . Stroke Other        <60 fm hx  . Diabetes Mother   . Hyperlipidemia Mother   . Heart disease Mother   . Hypertension Mother   . Hyperlipidemia Brother   . Stroke Brother 21  . Diabetes Paternal Aunt   . Heart attack Paternal Aunt   . Hyperlipidemia Paternal Aunt   . Hypertension Paternal Aunt   . Sudden death Paternal Aunt   . Diabetes Paternal Uncle   . Heart attack Paternal Uncle   . Hyperlipidemia Paternal Uncle   . Hypertension Paternal Uncle   . Sudden death Paternal Uncle   . Diabetes Maternal Grandfather   . Sudden death Maternal Grandfather   . Hypertension Maternal Grandfather   . Hyperlipidemia Maternal Grandfather   . Heart attack Maternal Grandfather   . Diabetes Paternal Grandmother   . Sudden death Paternal Grandmother   . Hyperlipidemia Paternal Grandmother   . Heart attack Paternal Grandmother   . Hypertension Paternal Grandmother   . Diabetes Paternal Grandfather   . Sudden death Paternal Grandfather   . Heart attack Paternal Grandfather   . Hyperlipidemia Paternal Grandfather   . Hypertension Paternal Grandfather   . Colon cancer Neg Hx   . Esophageal cancer Neg Hx   . Rectal cancer Neg Hx   . Stomach cancer Neg Hx     Past Surgical History:  Procedure Laterality Date  . APPENDECTOMY    . CESAREAN SECTION     x2  . COLONOSCOPY  05-10-13   per Dr. Henrene Pastor, sessile serrated polyp, repeat in 5 yrs    Social History   Occupational History  . Not on file  Tobacco Use  . Smoking status: Never Smoker  . Smokeless tobacco: Never Used  Vaping Use  . Vaping Use: Never used  Substance and Sexual Activity  . Alcohol use: Yes    Alcohol/week: 7.0 standard drinks    Types: 7 Glasses of wine per  week  . Drug use: No  . Sexual activity: Not on file

## 2019-11-22 ENCOUNTER — Encounter (HOSPITAL_BASED_OUTPATIENT_CLINIC_OR_DEPARTMENT_OTHER)
Admission: RE | Admit: 2019-11-22 | Discharge: 2019-11-22 | Disposition: A | Discharge status: Home / Self Care | Payer: 59 | Source: Ambulatory Visit | Attending: Orthopedic Surgery | Admitting: Orthopedic Surgery

## 2019-11-22 ENCOUNTER — Other Ambulatory Visit (HOSPITAL_COMMUNITY)
Admission: RE | Admit: 2019-11-22 | Discharge: 2019-11-22 | Disposition: A | Discharge status: Home / Self Care | Payer: 59 | Source: Ambulatory Visit | Attending: Orthopedic Surgery | Admitting: Orthopedic Surgery

## 2019-11-22 DIAGNOSIS — Z01818 Encounter for other preprocedural examination: Secondary | ICD-10-CM | POA: Insufficient documentation

## 2019-11-22 DIAGNOSIS — Z01812 Encounter for preprocedural laboratory examination: Secondary | ICD-10-CM | POA: Diagnosis present

## 2019-11-22 DIAGNOSIS — Z20822 Contact with and (suspected) exposure to covid-19: Secondary | ICD-10-CM | POA: Insufficient documentation

## 2019-11-22 LAB — CBC
HCT: 40.9 % (ref 36.0–46.0)
Hemoglobin: 13.2 g/dL (ref 12.0–15.0)
MCH: 28.4 pg (ref 26.0–34.0)
MCHC: 32.3 g/dL (ref 30.0–36.0)
MCV: 88 fL (ref 80.0–100.0)
Platelets: 227 10*3/uL (ref 150–400)
RBC: 4.65 MIL/uL (ref 3.87–5.11)
RDW: 13 % (ref 11.5–15.5)
WBC: 4.3 10*3/uL (ref 4.0–10.5)
nRBC: 0 % (ref 0.0–0.2)

## 2019-11-22 LAB — BASIC METABOLIC PANEL
Anion gap: 11 (ref 5–15)
BUN: 20 mg/dL (ref 6–20)
CO2: 26 mmol/L (ref 22–32)
Calcium: 9 mg/dL (ref 8.9–10.3)
Chloride: 101 mmol/L (ref 98–111)
Creatinine, Ser: 0.72 mg/dL (ref 0.44–1.00)
GFR, Estimated: >60 mL/min (ref >60)
Glucose, Bld: 93 mg/dL (ref 70–99)
Potassium: 4.1 mmol/L (ref 3.5–5.1)
Sodium: 138 mmol/L (ref 135–145)

## 2019-11-22 LAB — SARS CORONAVIRUS 2 (TAT 6-24 HRS): SARS Coronavirus 2: NEGATIVE

## 2019-11-22 NOTE — Progress Notes (Signed)

## 2019-11-26 ENCOUNTER — Ambulatory Visit (HOSPITAL_BASED_OUTPATIENT_CLINIC_OR_DEPARTMENT_OTHER): Payer: 59 | Admitting: Anesthesiology

## 2019-11-26 ENCOUNTER — Encounter (HOSPITAL_BASED_OUTPATIENT_CLINIC_OR_DEPARTMENT_OTHER): Payer: Self-pay | Admitting: Orthopedic Surgery

## 2019-11-26 ENCOUNTER — Encounter (HOSPITAL_BASED_OUTPATIENT_CLINIC_OR_DEPARTMENT_OTHER)
Admission: RE | Disposition: A | Discharge status: Home / Self Care | Payer: Self-pay | Source: Home / Self Care | Attending: Orthopedic Surgery

## 2019-11-26 ENCOUNTER — Ambulatory Visit (HOSPITAL_BASED_OUTPATIENT_CLINIC_OR_DEPARTMENT_OTHER)
Admission: RE | Admit: 2019-11-26 | Discharge: 2019-11-26 | Disposition: A | Discharge status: Home / Self Care | Payer: 59 | Attending: Orthopedic Surgery | Admitting: Orthopedic Surgery

## 2019-11-26 DIAGNOSIS — Y9373 Activity, racquet and hand sports: Secondary | ICD-10-CM | POA: Diagnosis not present

## 2019-11-26 DIAGNOSIS — Z823 Family history of stroke: Secondary | ICD-10-CM | POA: Insufficient documentation

## 2019-11-26 DIAGNOSIS — K219 Gastro-esophageal reflux disease without esophagitis: Secondary | ICD-10-CM | POA: Insufficient documentation

## 2019-11-26 DIAGNOSIS — Z8349 Family history of other endocrine, nutritional and metabolic diseases: Secondary | ICD-10-CM | POA: Insufficient documentation

## 2019-11-26 DIAGNOSIS — Z791 Long term (current) use of non-steroidal anti-inflammatories (NSAID): Secondary | ICD-10-CM | POA: Insufficient documentation

## 2019-11-26 DIAGNOSIS — S83242A Other tear of medial meniscus, current injury, left knee, initial encounter: Secondary | ICD-10-CM | POA: Insufficient documentation

## 2019-11-26 DIAGNOSIS — S83242D Other tear of medial meniscus, current injury, left knee, subsequent encounter: Secondary | ICD-10-CM

## 2019-11-26 DIAGNOSIS — S83512D Sprain of anterior cruciate ligament of left knee, subsequent encounter: Secondary | ICD-10-CM

## 2019-11-26 DIAGNOSIS — Z8249 Family history of ischemic heart disease and other diseases of the circulatory system: Secondary | ICD-10-CM | POA: Insufficient documentation

## 2019-11-26 DIAGNOSIS — Z7982 Long term (current) use of aspirin: Secondary | ICD-10-CM | POA: Diagnosis not present

## 2019-11-26 DIAGNOSIS — I10 Essential (primary) hypertension: Secondary | ICD-10-CM | POA: Diagnosis not present

## 2019-11-26 DIAGNOSIS — S83512A Sprain of anterior cruciate ligament of left knee, initial encounter: Secondary | ICD-10-CM | POA: Insufficient documentation

## 2019-11-26 DIAGNOSIS — Z833 Family history of diabetes mellitus: Secondary | ICD-10-CM | POA: Diagnosis not present

## 2019-11-26 DIAGNOSIS — Z79899 Other long term (current) drug therapy: Secondary | ICD-10-CM | POA: Insufficient documentation

## 2019-11-26 HISTORY — PX: MENISCUS REPAIR: SHX5179

## 2019-11-26 HISTORY — PX: ANTERIOR CRUCIATE LIGAMENT REPAIR: SHX115

## 2019-11-26 SURGERY — RECONSTRUCTION, KNEE, ACL, USING HAMSTRING GRAFT
Anesthesia: Regional | Site: Knee | Laterality: Left

## 2019-11-26 MED ORDER — EPHEDRINE SULFATE-NACL 50-0.9 MG/10ML-% IV SOSY
PREFILLED_SYRINGE | INTRAVENOUS | Status: DC | PRN
  Administered 2019-11-26: 5 mg via INTRAVENOUS
  Administered 2019-11-26: 10 mg via INTRAVENOUS
  Administered 2019-11-26: 5 mg via INTRAVENOUS

## 2019-11-26 MED ORDER — ONDANSETRON HCL 4 MG/2ML IJ SOLN
INTRAMUSCULAR | Status: AC
  Filled 2019-11-26: qty 2

## 2019-11-26 MED ORDER — ONDANSETRON HCL 4 MG/2ML IJ SOLN: 4.0000 mg | Freq: Once | INTRAMUSCULAR | Status: DC | PRN

## 2019-11-26 MED ORDER — LACTATED RINGERS IV SOLN: INTRAVENOUS | Status: DC

## 2019-11-26 MED ORDER — MORPHINE SULFATE (PF) 4 MG/ML IV SOLN
INTRAVENOUS | Status: DC | PRN
  Administered 2019-11-26: 8 mg

## 2019-11-26 MED ORDER — ROPIVACAINE HCL 7.5 MG/ML IJ SOLN
INTRAMUSCULAR | Status: DC | PRN
  Administered 2019-11-26: 20 mL via PERINEURAL

## 2019-11-26 MED ORDER — BUPIVACAINE-EPINEPHRINE 0.25% -1:200000 IJ SOLN
INTRAMUSCULAR | Status: DC | PRN
  Administered 2019-11-26 (×2): 15 mL

## 2019-11-26 MED ORDER — LIDOCAINE 2% (20 MG/ML) 5 ML SYRINGE
INTRAMUSCULAR | Status: AC
  Filled 2019-11-26: qty 5

## 2019-11-26 MED ORDER — PROPOFOL 10 MG/ML IV BOLUS
INTRAVENOUS | Status: AC
  Filled 2019-11-26: qty 20

## 2019-11-26 MED ORDER — MORPHINE SULFATE (PF) 4 MG/ML IV SOLN
INTRAVENOUS | Status: AC
Start: 2019-11-26 — End: ?
  Filled 2019-11-26: qty 2

## 2019-11-26 MED ORDER — PHENYLEPHRINE 40 MCG/ML (10ML) SYRINGE FOR IV PUSH (FOR BLOOD PRESSURE SUPPORT)
PREFILLED_SYRINGE | INTRAVENOUS | Status: DC | PRN
  Administered 2019-11-26: 80 ug via INTRAVENOUS

## 2019-11-26 MED ORDER — FENTANYL CITRATE (PF) 100 MCG/2ML IJ SOLN
INTRAMUSCULAR | Status: AC
  Filled 2019-11-26: qty 2

## 2019-11-26 MED ORDER — MIDAZOLAM HCL 2 MG/2ML IJ SOLN
2.0000 mg | Freq: Once | INTRAMUSCULAR | Status: AC
  Administered 2019-11-26: 2 mg via INTRAVENOUS

## 2019-11-26 MED ORDER — OXYCODONE HCL 5 MG PO TABS
5.0000 mg | ORAL_TABLET | ORAL | 0 refills | Status: DC | PRN
Start: 2019-11-26 — End: 2020-04-09

## 2019-11-26 MED ORDER — OXYCODONE HCL 5 MG/5ML PO SOLN: 5.0000 mg | Freq: Once | ORAL | Status: DC | PRN

## 2019-11-26 MED ORDER — MIDAZOLAM HCL 2 MG/2ML IJ SOLN
INTRAMUSCULAR | Status: AC
  Filled 2019-11-26: qty 2

## 2019-11-26 MED ORDER — CEFAZOLIN SODIUM-DEXTROSE 2-4 GM/100ML-% IV SOLN
2.0000 g | INTRAVENOUS | Status: AC
  Administered 2019-11-26: 2 g via INTRAVENOUS

## 2019-11-26 MED ORDER — ROCURONIUM BROMIDE 10 MG/ML (PF) SYRINGE
PREFILLED_SYRINGE | INTRAVENOUS | Status: AC
  Filled 2019-11-26: qty 10

## 2019-11-26 MED ORDER — SODIUM CHLORIDE 0.9 % IR SOLN
Status: DC | PRN
  Administered 2019-11-26: 12000 mL

## 2019-11-26 MED ORDER — DEXAMETHASONE SODIUM PHOSPHATE 10 MG/ML IJ SOLN
INTRAMUSCULAR | Status: DC | PRN
  Administered 2019-11-26: 5 mg

## 2019-11-26 MED ORDER — POVIDONE-IODINE 7.5 % EX SOLN
Freq: Once | CUTANEOUS | Status: DC
  Filled 2019-11-26: qty 118

## 2019-11-26 MED ORDER — POVIDONE-IODINE 10 % EX SWAB: 2.0000 "application " | Freq: Once | CUTANEOUS | Status: DC

## 2019-11-26 MED ORDER — OXYCODONE HCL 5 MG PO TABS: 5.0000 mg | ORAL_TABLET | Freq: Once | ORAL | Status: DC | PRN

## 2019-11-26 MED ORDER — SODIUM CHLORIDE 0.9 % IR SOLN
Status: DC | PRN
  Administered 2019-11-26: 6000 mL

## 2019-11-26 MED ORDER — CEFAZOLIN SODIUM-DEXTROSE 2-4 GM/100ML-% IV SOLN
INTRAVENOUS | Status: AC
  Filled 2019-11-26: qty 100

## 2019-11-26 MED ORDER — ONDANSETRON HCL 4 MG/2ML IJ SOLN
INTRAMUSCULAR | Status: DC | PRN
  Administered 2019-11-26: 4 mg via INTRAVENOUS

## 2019-11-26 MED ORDER — LIDOCAINE 2% (20 MG/ML) 5 ML SYRINGE
INTRAMUSCULAR | Status: DC | PRN
  Administered 2019-11-26: 60 mg via INTRAVENOUS

## 2019-11-26 MED ORDER — FENTANYL CITRATE (PF) 100 MCG/2ML IJ SOLN
50.0000 ug | Freq: Once | INTRAMUSCULAR | Status: AC
  Administered 2019-11-26: 50 ug via INTRAVENOUS

## 2019-11-26 MED ORDER — CLONIDINE HCL (ANALGESIA) 100 MCG/ML EP SOLN
EPIDURAL | Status: AC
  Filled 2019-11-26: qty 10

## 2019-11-26 MED ORDER — DEXAMETHASONE SODIUM PHOSPHATE 10 MG/ML IJ SOLN
INTRAMUSCULAR | Status: AC
  Filled 2019-11-26: qty 1

## 2019-11-26 MED ORDER — DEXAMETHASONE SODIUM PHOSPHATE 10 MG/ML IJ SOLN
INTRAMUSCULAR | Status: DC | PRN
  Administered 2019-11-26: 4 mg via INTRAVENOUS

## 2019-11-26 MED ORDER — FENTANYL CITRATE (PF) 100 MCG/2ML IJ SOLN
INTRAMUSCULAR | Status: DC | PRN
  Administered 2019-11-26: 25 ug via INTRAVENOUS
  Administered 2019-11-26: 50 ug via INTRAVENOUS
  Administered 2019-11-26: 25 ug via INTRAVENOUS

## 2019-11-26 MED ORDER — CLONIDINE HCL (ANALGESIA) 100 MCG/ML EP SOLN
EPIDURAL | Status: DC | PRN
  Administered 2019-11-26: 75 ug via INTRA_ARTICULAR

## 2019-11-26 MED ORDER — EPINEPHRINE PF 1 MG/ML IJ SOLN
INTRAMUSCULAR | Status: AC
  Filled 2019-11-26: qty 4

## 2019-11-26 MED ORDER — POVIDONE-IODINE 10 % EX SWAB
2.0000 "application " | Freq: Once | CUTANEOUS | Status: AC
  Administered 2019-11-26: 2 via TOPICAL

## 2019-11-26 MED ORDER — HYDROMORPHONE HCL 1 MG/ML IJ SOLN: 0.2500 mg | INTRAMUSCULAR | Status: DC | PRN

## 2019-11-26 MED ORDER — METHOCARBAMOL 500 MG PO TABS: 500.0000 mg | ORAL_TABLET | Freq: Three times a day (TID) | ORAL | 0 refills | Status: DC | PRN

## 2019-11-26 MED ORDER — BUPIVACAINE HCL (PF) 0.25 % IJ SOLN
INTRAMUSCULAR | Status: AC
  Filled 2019-11-26: qty 30

## 2019-11-26 MED ORDER — PROPOFOL 10 MG/ML IV BOLUS
INTRAVENOUS | Status: DC | PRN
  Administered 2019-11-26: 50 mg via INTRAVENOUS
  Administered 2019-11-26: 150 mg via INTRAVENOUS

## 2019-11-26 MED ORDER — BUPIVACAINE HCL (PF) 0.5 % IJ SOLN
INTRAMUSCULAR | Status: AC
  Filled 2019-11-26: qty 30

## 2019-11-26 SURGICAL SUPPLY — 118 items
ANCH SUT 2-0 5 STRG STRL LF (Miscellaneous) ×1 IMPLANT
ANCH SUT 2-0 5 STRL LF DISP (Miscellaneous) ×4 IMPLANT
ANCH SUT 24D 2-0 CVD FBRSTCH (Anchor) ×1 IMPLANT
ANCHOR BUTTON TIGHTROPE ACL RT (Orthopedic Implant) ×2 IMPLANT
BANDAGE ESMARK 6X9 LF (GAUZE/BANDAGES/DRESSINGS) IMPLANT
BLADE EXCALIBUR 4.0MM X 13CM (MISCELLANEOUS) ×1
BLADE EXCALIBUR 4.0X13 (MISCELLANEOUS) ×1 IMPLANT
BLADE SHAVER TORPEDO 4X13 (MISCELLANEOUS) IMPLANT
BLADE SURG 10 STRL SS (BLADE) ×3 IMPLANT
BLADE SURG 15 STRL LF DISP TIS (BLADE) ×1 IMPLANT
BLADE SURG 15 STRL SS (BLADE) ×6
BNDG CMPR 9X6 STRL LF SNTH (GAUZE/BANDAGES/DRESSINGS)
BNDG ELASTIC 4X5.8 VLCR STR LF (GAUZE/BANDAGES/DRESSINGS) ×5 IMPLANT
BNDG ELASTIC 6X5.8 VLCR STR LF (GAUZE/BANDAGES/DRESSINGS) ×3 IMPLANT
BNDG ESMARK 6X9 LF (GAUZE/BANDAGES/DRESSINGS)
BURR OVAL 8 FLU 4.0MM X 13CM (MISCELLANEOUS) ×1
BURR OVAL 8 FLU 4.0X13 (MISCELLANEOUS) ×2 IMPLANT
CLOSURE WOUND 1/2 X4 (GAUZE/BANDAGES/DRESSINGS) ×1
CONT SPEC 4OZ CLIKSEAL STRL BL (MISCELLANEOUS) ×2 IMPLANT
COOLER ICEMAN CLASSIC (MISCELLANEOUS) ×2 IMPLANT
COVER BACK TABLE 60X90IN (DRAPES) ×3 IMPLANT
COVER WAND RF STERILE (DRAPES) IMPLANT
CUFF TOURN SGL QUICK 34 (TOURNIQUET CUFF) ×3
CUFF TRNQT CYL 34X4.125X (TOURNIQUET CUFF) IMPLANT
CUTTER SUT JUGGER STITCH CU (CUTTER) ×2 IMPLANT
DISSECTOR  3.8MM X 13CM (MISCELLANEOUS)
DISSECTOR 3.8MM X 13CM (MISCELLANEOUS) IMPLANT
DISSECTOR 4.0MM X 13CM (MISCELLANEOUS) ×3 IMPLANT
DRAPE ARTHROSCOPY W/POUCH 90 (DRAPES) ×1 IMPLANT
DRAPE IMP U-DRAPE 54X76 (DRAPES) ×2 IMPLANT
DRAPE INCISE IOBAN 66X45 STRL (DRAPES) ×3 IMPLANT
DRAPE OEC MINIVIEW 54X84 (DRAPES) ×3 IMPLANT
DRAPE U-SHAPE 47X51 STRL (DRAPES) ×2 IMPLANT
DRAPE-T ARTHROSCOPY W/POUCH (DRAPES) ×2 IMPLANT
DRILL FLIPCUTTER III 6-12 (ORTHOPEDIC DISPOSABLE SUPPLIES) ×1 IMPLANT
DRSG PAD ABDOMINAL 8X10 ST (GAUZE/BANDAGES/DRESSINGS) IMPLANT
DRSG TEGADERM 4X4.75 (GAUZE/BANDAGES/DRESSINGS) ×12 IMPLANT
DURAPREP 26ML APPLICATOR (WOUND CARE) ×3 IMPLANT
DW OUTFLOW CASSETTE/TUBE SET (MISCELLANEOUS) ×3 IMPLANT
ELECT REM PT RETURN 9FT ADLT (ELECTROSURGICAL)
ELECTRODE REM PT RTRN 9FT ADLT (ELECTROSURGICAL) IMPLANT
EXCALIBUR 3.8MM X 13CM (MISCELLANEOUS) IMPLANT
FLIPCUTTER III 6-12 AR-1204FF (ORTHOPEDIC DISPOSABLE SUPPLIES) ×3
GAUZE SPONGE 4X4 12PLY STRL (GAUZE/BANDAGES/DRESSINGS) ×3 IMPLANT
GAUZE XEROFORM 1X8 LF (GAUZE/BANDAGES/DRESSINGS) ×6 IMPLANT
GLOVE BIO SURGEON STRL SZ7 (GLOVE) ×4 IMPLANT
GLOVE BIOGEL PI IND STRL 7.0 (GLOVE) ×1 IMPLANT
GLOVE BIOGEL PI IND STRL 8 (GLOVE) ×1 IMPLANT
GLOVE BIOGEL PI INDICATOR 7.0 (GLOVE) ×2
GLOVE BIOGEL PI INDICATOR 8 (GLOVE) ×2
GLOVE ECLIPSE 6.5 STRL STRAW (GLOVE) ×7 IMPLANT
GLOVE ECLIPSE 8.0 STRL XLNG CF (GLOVE) ×3 IMPLANT
GOWN STRL REUS W/ TWL LRG LVL3 (GOWN DISPOSABLE) ×2 IMPLANT
GOWN STRL REUS W/ TWL XL LVL3 (GOWN DISPOSABLE) ×1 IMPLANT
GOWN STRL REUS W/TWL LRG LVL3 (GOWN DISPOSABLE) ×6
GOWN STRL REUS W/TWL XL LVL3 (GOWN DISPOSABLE) ×3
IMMOBILIZER KNEE 22 UNIV (SOFTGOODS) ×2 IMPLANT
IMMOBILIZER KNEE 24 THIGH 36 (MISCELLANEOUS) IMPLANT
IMMOBILIZER KNEE 24 UNIV (MISCELLANEOUS)
IMP SYS 2ND FIX PEEK 4.75X19.1 (Miscellaneous) ×3 IMPLANT
IMPL FIBERSTITCH 2-0 CVD 24DEG (Anchor) ×2 IMPLANT
IMPL SYS 2ND FX PEEK 4.75X19.1 (Miscellaneous) IMPLANT
IV NS IRRIG 3000ML ARTHROMATIC (IV SOLUTION) ×12 IMPLANT
JUGGERSTITCH IMPLANT CVD (Miscellaneous) ×8 IMPLANT
JUGGERSTITCH IMPLANT STRAIGHT (Miscellaneous) ×2 IMPLANT
JUGGERSTITCH SLED CANNULA (MISCELLANEOUS) ×2 IMPLANT
KIT BIOCARTILAGE LG JOINT MIX (KITS) ×3 IMPLANT
MANIFOLD NEPTUNE II (INSTRUMENTS) ×3 IMPLANT
NDL HYPO 18GX1.5 BLUNT FILL (NEEDLE) ×1 IMPLANT
NDL SAFETY ECLIPSE 18X1.5 (NEEDLE) ×2 IMPLANT
NDL SPNL 18GX3.5 QUINCKE PK (NEEDLE) IMPLANT
NEEDLE HYPO 18GX1.5 BLUNT FILL (NEEDLE) ×3 IMPLANT
NEEDLE HYPO 18GX1.5 SHARP (NEEDLE) ×6
NEEDLE SPNL 18GX3.5 QUINCKE PK (NEEDLE) ×3 IMPLANT
PACK ARTHROSCOPY DSU (CUSTOM PROCEDURE TRAY) ×3 IMPLANT
PACK BASIN DAY SURGERY FS (CUSTOM PROCEDURE TRAY) ×3 IMPLANT
PAD CAST 4YDX4 CTTN HI CHSV (CAST SUPPLIES) ×1 IMPLANT
PAD COLD SHLDR WRAP-ON (PAD) ×2 IMPLANT
PADDING CAST COTTON 4X4 STRL (CAST SUPPLIES) ×3
PADDING CAST COTTON 6X4 STRL (CAST SUPPLIES) ×3 IMPLANT
PENCIL SMOKE EVACUATOR (MISCELLANEOUS) ×2 IMPLANT
PK GRAFTLINK AUTO IMPLANT SYST (Anchor) ×3 IMPLANT
PORT APPOLLO RF 90DEGREE MULTI (SURGICAL WAND) ×3 IMPLANT
PUTTY DBM ALLOSYNC PURE 5CC (Putty) ×3 IMPLANT
SLEEVE SCD COMPRESS KNEE MED (MISCELLANEOUS) ×3 IMPLANT
SPONGE LAP 18X18 RF (DISPOSABLE) ×3 IMPLANT
SPONGE LAP 4X18 RFD (DISPOSABLE) ×2 IMPLANT
STRIP CLOSURE SKIN 1/2X4 (GAUZE/BANDAGES/DRESSINGS) ×2 IMPLANT
SUCTION FRAZIER HANDLE 10FR (MISCELLANEOUS) ×3
SUCTION TUBE FRAZIER 10FR DISP (MISCELLANEOUS) ×1 IMPLANT
SUT 0 FIBERLOOP 38 BLUE TPR ND (SUTURE) ×3
SUT ETHILON 3 0 PS 1 (SUTURE) ×6 IMPLANT
SUT FIBERWIRE #2 38 T-5 BLUE (SUTURE)
SUT FIBERWIRE 2-0 18 17.9 3/8 (SUTURE) ×3
SUT MNCRL AB 3-0 PS2 18 (SUTURE) ×3 IMPLANT
SUT VIC AB 0 CT1 27 (SUTURE) ×6
SUT VIC AB 0 CT1 27XBRD ANBCTR (SUTURE) ×2 IMPLANT
SUT VIC AB 1 CT1 27 (SUTURE) ×3
SUT VIC AB 1 CT1 27XBRD ANBCTR (SUTURE) ×1 IMPLANT
SUT VIC AB 2-0 CT1 27 (SUTURE) ×3
SUT VIC AB 2-0 CT1 TAPERPNT 27 (SUTURE) ×1 IMPLANT
SUT VICRYL 0 UR6 27IN ABS (SUTURE) ×8 IMPLANT
SUTURE 0 FIBERLP 38 BLU TPR ND (SUTURE) IMPLANT
SUTURE FIBERWR #2 38 T-5 BLUE (SUTURE) IMPLANT
SUTURE FIBERWR 2-0 18 17.9 3/8 (SUTURE) IMPLANT
SUTURE TAPE 1.3 FIBERLOP 20 ST (SUTURE) IMPLANT
SUTURE TAPE TIGERLINK 1.3MM BL (SUTURE) IMPLANT
SUTURETAPE 1.3 FIBERLOOP 20 ST (SUTURE)
SUTURETAPE TIGERLINK 1.3MM BL (SUTURE)
SYR 5ML LL (SYRINGE) ×3 IMPLANT
SYR BULB IRRIG 60ML STRL (SYRINGE) IMPLANT
SYR TB 1ML LL NO SAFETY (SYRINGE) ×3 IMPLANT
SYSTEM GRAFT IMPLANT AUTOGRAFT (Anchor) IMPLANT
TOWEL GREEN STERILE FF (TOWEL DISPOSABLE) ×8 IMPLANT
TRAY DSU PREP LF (CUSTOM PROCEDURE TRAY) ×3 IMPLANT
TUBING ARTHROSCOPY IRRIG 16FT (MISCELLANEOUS) ×3 IMPLANT
WRAP KNEE MAXI GEL POST OP (GAUZE/BANDAGES/DRESSINGS) ×1 IMPLANT
YANKAUER SUCT BULB TIP NO VENT (SUCTIONS) ×1 IMPLANT

## 2019-11-26 NOTE — H&P (Signed)
Macenzie Burford Wade is an 59 y.o. female.   Chief Complaint: Left knee instability HPI: Bethany Wade is a 59 year old patient who injured her left knee approximately 6 weeks ago playing tennis.  Sustained left knee injury with subsequent effusion and symptomatic instability.  MRI scan shows ACL tear along with medial meniscal tear.  No personal or family history of DVT or pulmonary embolism.  Patient wishes to remain active in cutting and pivoting sports and she presents now for ACL reconstruction and meniscal debridement versus repair.  Past Medical History:  Diagnosis Date  . Anxiety   . GERD (gastroesophageal reflux disease)   . Hypertension   . Insomnia     Past Surgical History:  Procedure Laterality Date  . APPENDECTOMY    . CESAREAN SECTION     x2  . COLONOSCOPY  05-10-13   per Dr. Henrene Pastor, sessile serrated polyp, repeat in 5 yrs     Family History  Problem Relation Age of Onset  . Heart attack Father 25       mi; several brothers with SCD  . Diabetes Father   . Hyperlipidemia Father   . Sudden death Father   . Heart disease Father   . Hypertension Father   . Heart attack Sister 1  . Hyperlipidemia Sister   . Coronary artery disease Other        fm hx  . Diabetes Other        fm hx  . Hyperlipidemia Other        fm hx  . Hypertension Other        fm hx  . Stroke Other        <60 fm hx  . Diabetes Mother   . Hyperlipidemia Mother   . Heart disease Mother   . Hypertension Mother   . Hyperlipidemia Brother   . Stroke Brother 42  . Diabetes Paternal Aunt   . Heart attack Paternal Aunt   . Hyperlipidemia Paternal Aunt   . Hypertension Paternal Aunt   . Sudden death Paternal Aunt   . Diabetes Paternal Uncle   . Heart attack Paternal Uncle   . Hyperlipidemia Paternal Uncle   . Hypertension Paternal Uncle   . Sudden death Paternal Uncle   . Diabetes Maternal Grandfather   . Sudden death Maternal Grandfather   . Hypertension Maternal Grandfather   . Hyperlipidemia  Maternal Grandfather   . Heart attack Maternal Grandfather   . Diabetes Paternal Grandmother   . Sudden death Paternal Grandmother   . Hyperlipidemia Paternal Grandmother   . Heart attack Paternal Grandmother   . Hypertension Paternal Grandmother   . Diabetes Paternal Grandfather   . Sudden death Paternal Grandfather   . Heart attack Paternal Grandfather   . Hyperlipidemia Paternal Grandfather   . Hypertension Paternal Grandfather   . Colon cancer Neg Hx   . Esophageal cancer Neg Hx   . Rectal cancer Neg Hx   . Stomach cancer Neg Hx    Social History:  reports that she has never smoked. She has never used smokeless tobacco. She reports current alcohol use of about 7.0 standard drinks of alcohol per week. She reports that she does not use drugs.  Allergies: No Known Allergies  Medications Prior to Admission  Medication Sig Dispense Refill  . aspirin 81 MG EC tablet Take 81 mg by mouth daily.      Marland Kitchen aspirin-acetaminophen-caffeine (EXCEDRIN MIGRAINE) 250-250-65 MG tablet Take 1 tablet by mouth every 6 (six) hours as needed  for headache.    . diclofenac sodium (VOLTAREN) 1 % GEL Apply every 6 hours as needed 100 g 5  . lisinopril (ZESTRIL) 10 MG tablet TAKE 1 TABLET(10 MG) BY MOUTH DAILY 90 tablet 3  . magnesium gluconate (MAGONATE) 500 MG tablet Take 500 mg by mouth daily.    . Menthol-Camphor (TIGER BALM ARTHRITIS RUB EX) Apply 1 application topically as needed (muscle pain).    . Multiple Vitamins-Minerals (MULTIVITAMIN PO) Take by mouth daily.    . nabumetone (RELAFEN) 750 MG tablet Take 1 tablet (750 mg total) by mouth 2 (two) times daily as needed. 60 tablet 6  . sertraline (ZOLOFT) 50 MG tablet Take 1 tablet (50 mg total) by mouth daily. 90 tablet 3  . temazepam (RESTORIL) 15 MG capsule TAKE 2 CAPSULES(30 MG) BY MOUTH AT BEDTIME 60 capsule 5  . acetaminophen (TYLENOL) 500 MG tablet Take 500 mg by mouth every 6 (six) hours as needed for moderate pain.      No results found for  this or any previous visit (from the past 48 hour(s)). No results found.  Review of Systems  Musculoskeletal: Positive for arthralgias.  All other systems reviewed and are negative.   Blood pressure 118/75, pulse (!) 59, temperature 98.2 F (36.8 C), temperature source Oral, resp. rate 14, height 5\' 3"  (1.6 m), weight 60.2 kg, SpO2 100 %. Physical Exam Vitals reviewed.  HENT:     Head: Normocephalic.     Nose: Nose normal.     Mouth/Throat:     Mouth: Mucous membranes are moist.  Eyes:     Pupils: Pupils are equal, round, and reactive to light.  Cardiovascular:     Rate and Rhythm: Normal rate.     Pulses: Normal pulses.  Pulmonary:     Effort: Pulmonary effort is normal.  Abdominal:     General: Abdomen is flat.  Musculoskeletal:     Cervical back: Normal range of motion.  Skin:    General: Skin is warm.     Capillary Refill: Capillary refill takes less than 2 seconds.  Neurological:     General: No focal deficit present.     Mental Status: She is alert.  Psychiatric:        Mood and Affect: Mood normal.     Examination of the left knee demonstrates full extension and flexion to 125.  Collaterals are stable.  ACL laxity is present.  No posterior lateral rotatory instability is present.  Extensor mechanism is intact.  Pedal pulses palpable.  Ankle dorsiflexion intact. Assessment/Plan Impression is left knee ACL tear with medial meniscal tear as well in an active 59 year old patient.  Plan is allograft hamstring ACL reconstruction and meniscal debridement versus repair.  Risk and benefits are discussed including not limited to infection stiffness incomplete restoration of motion extended nature of the rehabilitative process is also discussed.  All questions answered.  Anderson Malta, MD 11/26/2019, 7:15 AM

## 2019-11-26 NOTE — Anesthesia Postprocedure Evaluation (Signed)
Anesthesia Post Note  Patient: Marylu Lund Plate  Procedure(s) Performed: LEFT KNEE RECONSTRUCTION ANTERIOR CRUCIATE LIGAMENT (ACL) WITH HAMSTRING GRAFT, MEDIAL MENISCAL REPAIR (Left Knee) REPAIR OF MENISCUS (Left Knee)     Patient location during evaluation: PACU Anesthesia Type: Regional and General Level of consciousness: awake and alert Pain management: pain level controlled Vital Signs Assessment: post-procedure vital signs reviewed and stable Respiratory status: spontaneous breathing, nonlabored ventilation, respiratory function stable and patient connected to nasal cannula oxygen Cardiovascular status: blood pressure returned to baseline and stable Postop Assessment: no apparent nausea or vomiting Anesthetic complications: no   No complications documented.  Last Vitals:  Vitals:   11/26/19 1045 11/26/19 1059  BP: 132/83 140/76  Pulse: 80 71  Resp: 17 18  Temp:  (!) 36.3 C  SpO2: 100% 100%    Last Pain:  Vitals:   11/26/19 1059  TempSrc:   PainSc: 0-No pain                 Belenda Cruise P Milina Pagett

## 2019-11-26 NOTE — Anesthesia Preprocedure Evaluation (Addendum)
Anesthesia Evaluation  Patient identified by MRN, date of birth, ID band Patient awake    Reviewed: Patient's Chart, lab work & pertinent test results  Airway Mallampati: II  TM Distance: >3 FB Neck ROM: Full    Dental  (+) Teeth Intact   Pulmonary neg pulmonary ROS,    Pulmonary exam normal        Cardiovascular hypertension, Pt. on medications  Rhythm:Regular Rate:Normal     Neuro/Psych  Headaches, Anxiety    GI/Hepatic Neg liver ROS, GERD  Medicated,  Endo/Other  negative endocrine ROS  Renal/GU negative Renal ROS  negative genitourinary   Musculoskeletal Left ACL, medial meniscal tear   Abdominal (+)  Abdomen: soft. Bowel sounds: normal.  Peds  Hematology negative hematology ROS (+)   Anesthesia Other Findings   Reproductive/Obstetrics                            Anesthesia Physical Anesthesia Plan  ASA: II  Anesthesia Plan: General and Regional   Post-op Pain Management:  Regional for Post-op pain   Induction: Intravenous  PONV Risk Score and Plan: 3 and Ondansetron, Dexamethasone, Midazolam and Treatment may vary due to age or medical condition  Airway Management Planned: Mask and LMA  Additional Equipment: None  Intra-op Plan:   Post-operative Plan: Extubation in OR  Informed Consent: I have reviewed the patients History and Physical, chart, labs and discussed the procedure including the risks, benefits and alternatives for the proposed anesthesia with the patient or authorized representative who has indicated his/her understanding and acceptance.     Dental advisory given  Plan Discussed with: CRNA  Anesthesia Plan Comments: (Lab Results      Component                Value               Date                      WBC                      4.3                 11/22/2019                HGB                      13.2                11/22/2019                HCT                       40.9                11/22/2019                MCV                      88.0                11/22/2019                PLT                      227  11/22/2019           )        Anesthesia Quick Evaluation

## 2019-11-26 NOTE — Brief Op Note (Signed)
   11/26/2019  10:37 AM  PATIENT:  Marylu Lund Traywick  59 y.o. female  PRE-OPERATIVE DIAGNOSIS:  left knee anterior cruciate ligament tear, medial meniscal tear  POST-OPERATIVE DIAGNOSIS:  left knee anterior cruciate ligament tear, medial meniscal tear  PROCEDURE:  Procedure(s): LEFT KNEE RECONSTRUCTION ANTERIOR CRUCIATE LIGAMENT (ACL) WITH HAMSTRING GRAFT, MEDIAL MENISCAL REPAIR REPAIR OF MENISCUS  SURGEON:  Surgeon(s): Meredith Pel, MD  ASSISTANT: magnant pa  ANESTHESIA:   general  EBL: 15 ml    Total I/O In: 1100 [I.V.:1000; IV Piggyback:100] Out: 50 [Blood:50]  BLOOD ADMINISTERED: none  DRAINS: none   LOCAL MEDICATIONS USED:  none  SPECIMEN:  No Specimen  COUNTS:  YES  TOURNIQUET:  * Missing tourniquet times found for documented tourniquets in log: [id number] *  DICTATION: .Other Dictation: Dictation Number [id number]  PLAN OF CARE: Discharge to home after PACU  PATIENT DISPOSITION:  PACU - hemodynamically stable

## 2019-11-26 NOTE — Progress Notes (Signed)
Assisted Dr. Greg Stoltzfus with left, ultrasound guided, adductor canal block. Side rails up, monitors on throughout procedure. See vital signs in flow sheet. Tolerated Procedure well.  

## 2019-11-26 NOTE — Anesthesia Procedure Notes (Signed)
Anesthesia Regional Block: Adductor canal block   Pre-Anesthetic Checklist: ,, timeout performed, Correct Patient, Correct Site, Correct Laterality, Correct Procedure, Correct Position, site marked, Risks and benefits discussed,  Surgical consent,  Pre-op evaluation,  At surgeon's request and post-op pain management  Laterality: Left  Prep: Dura Prep       Needles:  Injection technique: Single-shot  Needle Type: Echogenic Stimulator Needle     Needle Length: 9cm  Needle Gauge: 20     Additional Needles:   Procedures:,,,, ultrasound used (permanent image in chart),,,,  Narrative:  Start time: 11/26/2019 7:00 AM End time: 11/26/2019 7:05 AM Injection made incrementally with aspirations every 5 mL.  Performed by: Personally  Anesthesiologist: Darral Dash, DO  Additional Notes: Patient identified. Risks/Benefits/Options discussed with patient including but not limited to bleeding, infection, nerve damage, failed block, incomplete pain control. Patient expressed understanding and wished to proceed. All questions were answered. Sterile technique was used throughout the entire procedure. Please see nursing notes for vital signs. Aspirated in 5cc intervals with injection for negative confirmation. Patient was given instructions on fall risk and not to get out of bed. All questions and concerns addressed with instructions to call with any issues or inadequate analgesia.

## 2019-11-26 NOTE — Transfer of Care (Signed)
Immediate Anesthesia Transfer of Care Note  Patient: Bethany Wade  Procedure(s) Performed: LEFT KNEE RECONSTRUCTION ANTERIOR CRUCIATE LIGAMENT (ACL) WITH HAMSTRING GRAFT, MEDIAL MENISCAL REPAIR (Left Knee) REPAIR OF MENISCUS (Left Knee)  Patient Location: PACU  Anesthesia Type:GA combined with regional for post-op pain  Level of Consciousness: drowsy  Airway & Oxygen Therapy: Patient Spontanous Breathing and Patient connected to nasal cannula oxygen  Post-op Assessment: Report given to RN and Post -op Vital signs reviewed and stable  Post vital signs: Reviewed and stable  Last Vitals:  Vitals Value Taken Time  BP 114/77 11/26/19 1030  Temp 36.3 C 11/26/19 1030  Pulse 93 11/26/19 1036  Resp 17 11/26/19 1036  SpO2 100 % 11/26/19 1036  Vitals shown include unvalidated device data.  Last Pain:  Vitals:   11/26/19 1030  TempSrc:   PainSc: 0-No pain         Complications: No complications documented.

## 2019-11-26 NOTE — Anesthesia Procedure Notes (Signed)
Procedure Name: LMA Insertion °Performed by: Magdaline Zollars H, CRNA °Pre-anesthesia Checklist: Patient identified, Emergency Drugs available, Suction available and Patient being monitored °Patient Re-evaluated:Patient Re-evaluated prior to induction °Oxygen Delivery Method: Circle System Utilized °Preoxygenation: Pre-oxygenation with 100% oxygen °Induction Type: IV induction °Ventilation: Mask ventilation without difficulty °LMA: LMA inserted °LMA Size: 4.0 °Number of attempts: 1 °Airway Equipment and Method: Bite block °Placement Confirmation: positive ETCO2 °Tube secured with: Tape °Dental Injury: Teeth and Oropharynx as per pre-operative assessment  ° ° ° ° ° ° °

## 2019-11-26 NOTE — Discharge Instructions (Signed)

## 2019-11-26 NOTE — Op Note (Signed)
NAME: Bethany Wade, [facility] MEDICAL RECORD VQ:[id number] ACCOUNT [id number] DATE OF BIRTH:Nov 29, 1960 FACILITY: [facility] LOCATION: [facility] PHYSICIAN:Azad Calame Randel Pigg, MD  OPERATIVE REPORT  DATE OF PROCEDURE:  11/26/2019  PREOPERATIVE DIAGNOSIS:  Left knee anterior cruciate ligament tear, meniscal tear.  POSTOPERATIVE DIAGNOSIS:  Left knee anterior cruciate ligament tear, meniscal tear.  PROCEDURE:   1.  Left knee anterior cruciate ligament reconstruction. 2.  Medial meniscal repair.  SURGEON:  Meredith Pel, MD  ASSISTANT:  Annie Main, PA.  INDICATIONS:  The patient is a 59 year old active patient with left knee pain following an injury playing tennis about 6 weeks ago.  She presents now for operative management of ligament tear and medial meniscal tear.  PROCEDURE IN DETAIL:  The patient was brought to the operating room where LMA anesthesia was induced.  Preoperative antibiotics administered.  Timeout was called.  Left leg prescrubbed with alcohol and Betadine, allowed to air dry, prepped with DuraPrep  solution and draped in a sterile manner.  Ioban used to cover the operative field.  Examination of the left knee demonstrated full flexion, full extension.  She had good stability to varus and valgus stress at 0, 30 and 90 degrees.  The ACL was out.  The  PCL intact.  No posterolateral rotatory instability was noted.  After calling a timeout, the anterior inferolateral and anterior inferomedial portals were numbed up with Marcaine and lidocaine.  At this time, the semitendinosis tendon was harvested  through an incision longitudinally over the pes bursa tendons.  Skin and subcutaneous tissue were sharply divided.  Tissue quality was marginal.  Semitendinosis tendon was harvested and prepared on the back table using dual Endobutton technique from  Arthrex by Annie Main, PA.  This tendon was not sufficient to give [place] a minimum 8 mm graft.  Gracilis was also harvested and  incorporated into the graft.  This was prepared using dual Endobutton technique.  Concurrent with graft preparation, the  anterior inferolateral and anterior inferomedial portals were established.  Diagnostic arthroscopy was performed.  The lateral compartment was intact with intact articular cartilage and meniscus medial compartment demonstrated tear of that posterior horn  of the medial meniscus, which essentially involved the entire meniscus posterior horn, which was unstable.  There were some early chondral changes from instability of that meniscus.  That meniscal capsular interface was then prepared using a rasp.  The  patellofemoral joint was intact.  The ACL was torn.  ACL debridement notchplasty performed.  Next, using a combination of Biomet Arthrex all-inside suture meniscal suture devices, 4 vertical mattress sutures were placed to reapproximate the meniscus back  to its capsular attachment.  Care was taken to avoid injury to the posterior neurovascular structures.  All in all secure repair was achieved by using a combination of 16 mm and 18 mm depth on the repair.  Following that repair, the guide was placed  onto the femur.  Drilled 110 degrees in the 3 o'clock position.  This was done using a FlipCutter.  FlipCutter also used to drill on the tibial side, 8 mm.  Graft was passed with bone graft placed into the tunnels, secured with flipping of the button,  confirmed with fluoroscopy on the femoral side.  Taken through a range of motion and then tightened in extension on the tibial side.  This Endobutton fixation was used on the tibial side, supplemented with SwiveLock.  All in all, a very secure knee was  recreated.  The knee joint was then thoroughly irrigated.  The incision site was also thoroughly irrigated.  A solution of Marcaine, morphine, clonidine injected into the knee for postop pain relief.  Next, the portals were closed using 2-0 Vicryl, 3-0  nylon.  The harvest incision closed using  0 Vicryl suture, 2-0 Vicryl suture and a 3-0 Monocryl.  Steri-Strips and waterproof dressings applied.  Ace wrap and ice device applied to the right knee.  Knee immobilizer applied.  The patient will be touchdown  weightbearing with CPM starting tonight.  Follow up in 1 week for clinical recheck.  Luke's assistance was required for graft preparation, opening and closing, graft was soaked in vancomycin soaked sponge.  The patient tolerated the procedure well  without immediate complications.  He was transferred to the recovery room in stable condition.  VN/NUANCE  D:11/26/2019 T:11/26/2019 JOB:[id number]

## 2019-11-27 ENCOUNTER — Encounter (HOSPITAL_BASED_OUTPATIENT_CLINIC_OR_DEPARTMENT_OTHER): Payer: Self-pay | Admitting: Orthopedic Surgery

## 2019-12-05 ENCOUNTER — Ambulatory Visit (INDEPENDENT_AMBULATORY_CARE_PROVIDER_SITE_OTHER): Payer: 59 | Admitting: Orthopedic Surgery

## 2019-12-05 ENCOUNTER — Telehealth: Payer: Self-pay

## 2019-12-05 ENCOUNTER — Other Ambulatory Visit: Payer: Self-pay

## 2019-12-05 ENCOUNTER — Ambulatory Visit (HOSPITAL_COMMUNITY)
Admission: RE | Admit: 2019-12-05 | Discharge: 2019-12-05 | Disposition: A | Discharge status: Home / Self Care | Payer: 59 | Source: Ambulatory Visit | Attending: Orthopedic Surgery | Admitting: Orthopedic Surgery

## 2019-12-05 ENCOUNTER — Other Ambulatory Visit: Payer: Self-pay | Admitting: Surgical

## 2019-12-05 DIAGNOSIS — M25562 Pain in left knee: Secondary | ICD-10-CM

## 2019-12-05 MED ORDER — METHOCARBAMOL 500 MG PO TABS: 500.0000 mg | ORAL_TABLET | Freq: Three times a day (TID) | ORAL | 0 refills | Status: DC | PRN

## 2019-12-05 MED ORDER — HYDROCODONE-ACETAMINOPHEN 5-325 MG PO TABS
1.0000 | ORAL_TABLET | Freq: Two times a day (BID) | ORAL | 0 refills | Status: DC | PRN
Start: 2019-12-05 — End: 2020-04-09

## 2019-12-05 NOTE — Telephone Encounter (Signed)
Sent in

## 2019-12-05 NOTE — Telephone Encounter (Signed)
See below

## 2019-12-05 NOTE — Telephone Encounter (Signed)
[facility] called wanting to let Dr. Marlou Sa know that patient is Negative for DVT, left leg.  Please advise.  Thank you.

## 2019-12-05 NOTE — Telephone Encounter (Signed)
Patient seen by Dr Marlou Sa today He is wanting patient to have rx for norco 5/325 1po q 12 #20

## 2019-12-06 NOTE — Telephone Encounter (Signed)
thx

## 2019-12-08 ENCOUNTER — Encounter: Payer: Self-pay | Admitting: Orthopedic Surgery

## 2019-12-08 NOTE — Progress Notes (Signed)
Post-Op Visit Note   Patient: Bethany Wade           Date of Birth: 1960/12/13           MRN: [id number] Visit Date: 12/05/2019 PCP: Laurey Morale, MD   Assessment & Plan:  Chief Complaint:  Chief Complaint  Patient presents with   Left Knee - Pain   Visit Diagnoses:  1. Acute pain of left knee     Plan: Reason 58 year old patient is now about 2 weeks out left knee ACL reconstruction and medial meniscal repair.  90 degrees on CPM machine.  Negative thigh tenderness but mild calf tenderness.  Ultrasound negative at the time of this dictation for DVT.  She is on aspirin for DVT prophylaxis.  On exam she does have reasonable flexion and good extension.  Flexion is about 80 degrees cold.  Graft stability excellent.  Ultrasound performed to rule out DVT because of some mild calf tenderness.  That was negative.  Norco prescribed.  Refill Robaxin.  DC sutures.  Continue with CPM machine through the end of the week and then we will transition her to outpatient physical therapy.  She is starting that today.  Nonweightbearing for 11 more days then okay to start touchdown weightbearing at that time.  She will need to have bone density ordered next clinic visit due to diminished bone quality at the time of surgery.  Follow-Up Instructions: Return in about 2 weeks (around 12/19/2019).   Orders:  Orders Placed This Encounter  Procedures   VAS [place] LOWER EXTREMITY VENOUS (DVT)   Meds ordered this encounter  Medications   methocarbamol (ROBAXIN) 500 MG tablet    Sig: Take 1 tablet (500 mg total) by mouth every 8 (eight) hours as needed for muscle spasms.    Dispense:  30 tablet    Refill:  0    Imaging: No results found.  PMFS History: Patient Active Problem List   Diagnosis Date Noted   HTN (hypertension) 02/13/2014   GERD (gastroesophageal reflux disease) 03/07/2013   Other and unspecified hyperlipidemia 07/25/2012   Left shoulder pain 12/14/2010   Left leg pain  12/01/2010   MIGRAINE HEADACHE 06/06/2009   TALIPES CAVUS 04/28/2009   GANGLION CYST, WRIST, RIGHT 03/25/2009   LIVER FUNCTION TESTS, ABNORMAL, HX OF 10/08/2008   INSOMNIA 10/16/2007   Anxiety state 08/16/2006   POSITIVE PPD 08/16/2006   COLONIC POLYPS, BENIGN, HX OF 08/16/2006   Past Medical History:  Diagnosis Date   Anxiety    GERD (gastroesophageal reflux disease)    Hypertension    Insomnia     Family History  Problem Relation Age of Onset   Heart attack Father 72       mi; several brothers with SCD   Diabetes Father    Hyperlipidemia Father    Sudden death Father    Heart disease Father    Hypertension Father    Heart attack Sister 32   Hyperlipidemia Sister    Coronary artery disease Other        fm hx   Diabetes Other        fm hx   Hyperlipidemia Other        fm hx   Hypertension Other        fm hx   Stroke Other        <60 fm hx   Diabetes Mother    Hyperlipidemia Mother    Heart disease Mother    Hypertension Mother  Hyperlipidemia Brother    Stroke Brother 13   Diabetes Paternal Aunt    Heart attack Paternal Aunt    Hyperlipidemia Paternal Aunt    Hypertension Paternal Aunt    Sudden death Paternal Aunt    Diabetes Paternal Uncle    Heart attack Paternal Uncle    Hyperlipidemia Paternal Uncle    Hypertension Paternal Uncle    Sudden death Paternal Uncle    Diabetes Maternal Grandfather    Sudden death Maternal Grandfather    Hypertension Maternal Grandfather    Hyperlipidemia Maternal Grandfather    Heart attack Maternal Grandfather    Diabetes Paternal Grandmother    Sudden death Paternal Grandmother    Hyperlipidemia Paternal Grandmother    Heart attack Paternal Grandmother    Hypertension Paternal Grandmother    Diabetes Paternal Grandfather    Sudden death Paternal Grandfather    Heart attack Paternal Grandfather    Hyperlipidemia Paternal Grandfather    Hypertension Paternal  Grandfather    Colon cancer Neg Hx    Esophageal cancer Neg Hx    Rectal cancer Neg Hx    Stomach cancer Neg Hx     Past Surgical History:  Procedure Laterality Date   ANTERIOR CRUCIATE LIGAMENT REPAIR Left 11/26/2019   Procedure: LEFT KNEE RECONSTRUCTION ANTERIOR CRUCIATE LIGAMENT (ACL) WITH HAMSTRING GRAFT, MEDIAL MENISCAL REPAIR;  Surgeon: Meredith Pel, MD;  Location: [place];  Service: Orthopedics;  Laterality: Left;   APPENDECTOMY     CESAREAN SECTION     x2   COLONOSCOPY  05-10-13   per Dr. Henrene Pastor, sessile serrated polyp, repeat in 5 yrs    MENISCUS REPAIR Left 11/26/2019   Procedure: REPAIR OF MENISCUS;  Surgeon: Meredith Pel, MD;  Location: [place];  Service: Orthopedics;  Laterality: Left;   Social History   Occupational History   Not on file  Tobacco Use   Smoking status: Never Smoker   Smokeless tobacco: Never Used  Vaping Use   Vaping Use: Never used  Substance and Sexual Activity   Alcohol use: Yes    Alcohol/week: 7.0 standard drinks    Types: 7 Glasses of wine per week   Drug use: No   Sexual activity: Not on file

## 2019-12-12 ENCOUNTER — Other Ambulatory Visit: Payer: Self-pay

## 2019-12-12 DIAGNOSIS — Z780 Asymptomatic menopausal state: Secondary | ICD-10-CM

## 2019-12-19 ENCOUNTER — Ambulatory Visit (INDEPENDENT_AMBULATORY_CARE_PROVIDER_SITE_OTHER): Payer: 59 | Admitting: Orthopedic Surgery

## 2019-12-19 ENCOUNTER — Other Ambulatory Visit: Payer: Self-pay

## 2019-12-19 DIAGNOSIS — S83512D Sprain of anterior cruciate ligament of left knee, subsequent encounter: Secondary | ICD-10-CM

## 2019-12-22 ENCOUNTER — Encounter: Payer: Self-pay | Admitting: Orthopedic Surgery

## 2019-12-22 NOTE — Progress Notes (Signed)
Post-Op Visit Note   Patient: Bethany Wade           Date of Birth: 1960-03-23           MRN: [id number] Visit Date: 12/19/2019 PCP: Laurey Morale, MD   Assessment & Plan:  Chief Complaint:  Chief Complaint  Patient presents with  . Left Knee - Routine Post Op   Visit Diagnoses: No diagnosis found.  Plan: Patient is a 59 year old female who presents s/p left knee ACL reconstruction with hamstring autograft and medial meniscal repair.  She was initially nonweightbearing but she has now begun touchdown weightbearing with a cane for the first time today since her procedure.  She has been going to physical therapy 2 times per week at [facility] physical therapy.  She is up to 96 degrees on CPM machine.  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  She takes occasional Tylenol for pain but does not have to take any narcotic pain medication.  She has had a previous ultrasound and has been found negative for DVT.  On exam her incisions are healing well with no evidence of infection or dehiscence.  Graft is stable on Lachman exam.  No significant effusion.  No calf tenderness.  Negative Homans' sign.  Able to perform multiple straight leg raises easily.  0 to 5 degrees of extension.  85 degrees of flexion.  She is compliant with taking aspirin for DVT prophylaxis.  Plan on 3-week return for clinical recheck.  We will likely begin full weightbearing at that point.  Follow-Up Instructions: No follow-ups on file.   Orders:  No orders of the defined types were placed in this encounter.  No orders of the defined types were placed in this encounter.   Imaging: No results found.  PMFS History: Patient Active Problem List   Diagnosis Date Noted  . HTN (hypertension) 02/13/2014  . GERD (gastroesophageal reflux disease) 03/07/2013  . Other and unspecified hyperlipidemia 07/25/2012  . Left shoulder pain 12/14/2010  . Left leg pain 12/01/2010  . MIGRAINE HEADACHE 06/06/2009  .  TALIPES CAVUS 04/28/2009  . GANGLION CYST, WRIST, RIGHT 03/25/2009  . LIVER FUNCTION TESTS, ABNORMAL, HX OF 10/08/2008  . INSOMNIA 10/16/2007  . Anxiety state 08/16/2006  . POSITIVE PPD 08/16/2006  . COLONIC POLYPS, BENIGN, HX OF 08/16/2006   Past Medical History:  Diagnosis Date  . Anxiety   . GERD (gastroesophageal reflux disease)   . Hypertension   . Insomnia     Family History  Problem Relation Age of Onset  . Heart attack Father 7       mi; several brothers with SCD  . Diabetes Father   . Hyperlipidemia Father   . Sudden death Father   . Heart disease Father   . Hypertension Father   . Heart attack Sister 79  . Hyperlipidemia Sister   . Coronary artery disease Other        fm hx  . Diabetes Other        fm hx  . Hyperlipidemia Other        fm hx  . Hypertension Other        fm hx  . Stroke Other        <60 fm hx  . Diabetes Mother   . Hyperlipidemia Mother   . Heart disease Mother   . Hypertension Mother   . Hyperlipidemia Brother   . Stroke Brother 57  . Diabetes Paternal Aunt   .  Heart attack Paternal Aunt   . Hyperlipidemia Paternal Aunt   . Hypertension Paternal Aunt   . Sudden death Paternal Aunt   . Diabetes Paternal Uncle   . Heart attack Paternal Uncle   . Hyperlipidemia Paternal Uncle   . Hypertension Paternal Uncle   . Sudden death Paternal Uncle   . Diabetes Maternal Grandfather   . Sudden death Maternal Grandfather   . Hypertension Maternal Grandfather   . Hyperlipidemia Maternal Grandfather   . Heart attack Maternal Grandfather   . Diabetes Paternal Grandmother   . Sudden death Paternal Grandmother   . Hyperlipidemia Paternal Grandmother   . Heart attack Paternal Grandmother   . Hypertension Paternal Grandmother   . Diabetes Paternal Grandfather   . Sudden death Paternal Grandfather   . Heart attack Paternal Grandfather   . Hyperlipidemia Paternal Grandfather   . Hypertension Paternal Grandfather   . Colon cancer Neg Hx   .  Esophageal cancer Neg Hx   . Rectal cancer Neg Hx   . Stomach cancer Neg Hx     Past Surgical History:  Procedure Laterality Date  . ANTERIOR CRUCIATE LIGAMENT REPAIR Left 11/26/2019   Procedure: LEFT KNEE RECONSTRUCTION ANTERIOR CRUCIATE LIGAMENT (ACL) WITH HAMSTRING GRAFT, MEDIAL MENISCAL REPAIR;  Surgeon: Meredith Pel, MD;  Location: [place];  Service: Orthopedics;  Laterality: Left;  . APPENDECTOMY    . CESAREAN SECTION     x2  . COLONOSCOPY  05-10-13   per Dr. Henrene Pastor, sessile serrated polyp, repeat in 5 yrs   . MENISCUS REPAIR Left 11/26/2019   Procedure: REPAIR OF MENISCUS;  Surgeon: Meredith Pel, MD;  Location: [place];  Service: Orthopedics;  Laterality: Left;   Social History   Occupational History  . Not on file  Tobacco Use  . Smoking status: Never Smoker  . Smokeless tobacco: Never Used  Vaping Use  . Vaping Use: Never used  Substance and Sexual Activity  . Alcohol use: Yes    Alcohol/week: 7.0 standard drinks    Types: 7 Glasses of wine per week  . Drug use: No  . Sexual activity: Not on file

## 2019-12-26 DIAGNOSIS — M25562 Pain in left knee: Secondary | ICD-10-CM | POA: Diagnosis not present

## 2019-12-31 DIAGNOSIS — M25562 Pain in left knee: Secondary | ICD-10-CM | POA: Diagnosis not present

## 2020-01-02 DIAGNOSIS — M25562 Pain in left knee: Secondary | ICD-10-CM | POA: Diagnosis not present

## 2020-01-09 ENCOUNTER — Ambulatory Visit (INDEPENDENT_AMBULATORY_CARE_PROVIDER_SITE_OTHER): Payer: BC Managed Care – PPO | Admitting: Orthopedic Surgery

## 2020-01-09 DIAGNOSIS — S83512D Sprain of anterior cruciate ligament of left knee, subsequent encounter: Secondary | ICD-10-CM

## 2020-01-13 ENCOUNTER — Encounter: Payer: Self-pay | Admitting: Orthopedic Surgery

## 2020-01-13 NOTE — Progress Notes (Signed)
Post-Op Visit Note   Patient: Bethany Wade           Date of Birth: 08/19/1960           MRN: [id number] Visit Date: 01/09/2020 PCP: Laurey Morale, MD   Assessment & Plan:  Chief Complaint:  Chief Complaint  Patient presents with  . Left Knee - Routine Post Op   Visit Diagnoses:  1. Rupture of anterior cruciate ligament of left knee, subsequent encounter     Plan: Bethany Wade is a 59 year old patient underwent left knee ACL reconstruction and meniscal repair 11/26/2019.  Ambulating with a cane.  Going to physical therapy.  Bone density was ordered on 1117 but has not really been put into the q. yet by the facility.  [name] did order the study on 17 November.  On exam today negative Homans no calf tenderness.  Range of motion is to about 100 of flexion with good extension.  Graft is stable.  2 mm anterior drawer solid endpoint.  Trace effusion present.  Plan at this time is to continue with therapy primarily for strengthening and stretching to achieve a little bit more flexion.  I think starting on a stationary bike would also be a good rehab tool.  Follow-up in 4 weeks for clinical recheck.  Follow-Up Instructions: No follow-ups on file.   Orders:  No orders of the defined types were placed in this encounter.  No orders of the defined types were placed in this encounter.   Imaging: No results found.  PMFS History: Patient Active Problem List   Diagnosis Date Noted  . HTN (hypertension) 02/13/2014  . GERD (gastroesophageal reflux disease) 03/07/2013  . Other and unspecified hyperlipidemia 07/25/2012  . Left shoulder pain 12/14/2010  . Left leg pain 12/01/2010  . MIGRAINE HEADACHE 06/06/2009  . TALIPES CAVUS 04/28/2009  . GANGLION CYST, WRIST, RIGHT 03/25/2009  . LIVER FUNCTION TESTS, ABNORMAL, HX OF 10/08/2008  . INSOMNIA 10/16/2007  . Anxiety state 08/16/2006  . POSITIVE PPD 08/16/2006  . COLONIC POLYPS, BENIGN, HX OF 08/16/2006   Past Medical History:   Diagnosis Date  . Anxiety   . GERD (gastroesophageal reflux disease)   . Hypertension   . Insomnia     Family History  Problem Relation Age of Onset  . Heart attack Father 31       mi; several brothers with SCD  . Diabetes Father   . Hyperlipidemia Father   . Sudden death Father   . Heart disease Father   . Hypertension Father   . Heart attack Sister 58  . Hyperlipidemia Sister   . Coronary artery disease Other        fm hx  . Diabetes Other        fm hx  . Hyperlipidemia Other        fm hx  . Hypertension Other        fm hx  . Stroke Other        <60 fm hx  . Diabetes Mother   . Hyperlipidemia Mother   . Heart disease Mother   . Hypertension Mother   . Hyperlipidemia Brother   . Stroke Brother 71  . Diabetes Paternal Aunt   . Heart attack Paternal Aunt   . Hyperlipidemia Paternal Aunt   . Hypertension Paternal Aunt   . Sudden death Paternal Aunt   . Diabetes Paternal Uncle   . Heart attack Paternal Uncle   . Hyperlipidemia Paternal Uncle   .  Hypertension Paternal Uncle   . Sudden death Paternal Uncle   . Diabetes Maternal Grandfather   . Sudden death Maternal Grandfather   . Hypertension Maternal Grandfather   . Hyperlipidemia Maternal Grandfather   . Heart attack Maternal Grandfather   . Diabetes Paternal Grandmother   . Sudden death Paternal Grandmother   . Hyperlipidemia Paternal Grandmother   . Heart attack Paternal Grandmother   . Hypertension Paternal Grandmother   . Diabetes Paternal Grandfather   . Sudden death Paternal Grandfather   . Heart attack Paternal Grandfather   . Hyperlipidemia Paternal Grandfather   . Hypertension Paternal Grandfather   . Colon cancer Neg Hx   . Esophageal cancer Neg Hx   . Rectal cancer Neg Hx   . Stomach cancer Neg Hx     Past Surgical History:  Procedure Laterality Date  . ANTERIOR CRUCIATE LIGAMENT REPAIR Left 11/26/2019   Procedure: LEFT KNEE RECONSTRUCTION ANTERIOR CRUCIATE LIGAMENT (ACL) WITH HAMSTRING  GRAFT, MEDIAL MENISCAL REPAIR;  Surgeon: Meredith Pel, MD;  Location: [place];  Service: Orthopedics;  Laterality: Left;  . APPENDECTOMY    . CESAREAN SECTION     x2  . COLONOSCOPY  05-10-13   per Dr. Henrene Pastor, sessile serrated polyp, repeat in 5 yrs   . MENISCUS REPAIR Left 11/26/2019   Procedure: REPAIR OF MENISCUS;  Surgeon: Meredith Pel, MD;  Location: [place];  Service: Orthopedics;  Laterality: Left;   Social History   Occupational History  . Not on file  Tobacco Use  . Smoking status: Never Smoker  . Smokeless tobacco: Never Used  Vaping Use  . Vaping Use: Never used  Substance and Sexual Activity  . Alcohol use: Yes    Alcohol/week: 7.0 standard drinks    Types: 7 Glasses of wine per week  . Drug use: No  . Sexual activity: Not on file

## 2020-01-14 DIAGNOSIS — M25562 Pain in left knee: Secondary | ICD-10-CM | POA: Diagnosis not present

## 2020-01-16 DIAGNOSIS — M25562 Pain in left knee: Secondary | ICD-10-CM | POA: Diagnosis not present

## 2020-01-21 DIAGNOSIS — M25562 Pain in left knee: Secondary | ICD-10-CM | POA: Diagnosis not present

## 2020-01-23 DIAGNOSIS — M25562 Pain in left knee: Secondary | ICD-10-CM | POA: Diagnosis not present

## 2020-01-30 DIAGNOSIS — M25562 Pain in left knee: Secondary | ICD-10-CM | POA: Diagnosis not present

## 2020-02-01 DIAGNOSIS — M25562 Pain in left knee: Secondary | ICD-10-CM | POA: Diagnosis not present

## 2020-02-05 DIAGNOSIS — M25562 Pain in left knee: Secondary | ICD-10-CM | POA: Diagnosis not present

## 2020-02-08 DIAGNOSIS — M25562 Pain in left knee: Secondary | ICD-10-CM | POA: Diagnosis not present

## 2020-02-13 ENCOUNTER — Ambulatory Visit: Payer: BC Managed Care – PPO | Admitting: Orthopedic Surgery

## 2020-02-14 DIAGNOSIS — M25562 Pain in left knee: Secondary | ICD-10-CM | POA: Diagnosis not present

## 2020-02-18 ENCOUNTER — Encounter: Payer: 59 | Admitting: Family Medicine

## 2020-02-18 DIAGNOSIS — M25562 Pain in left knee: Secondary | ICD-10-CM | POA: Diagnosis not present

## 2020-02-21 DIAGNOSIS — M25562 Pain in left knee: Secondary | ICD-10-CM | POA: Diagnosis not present

## 2020-02-27 DIAGNOSIS — M25562 Pain in left knee: Secondary | ICD-10-CM | POA: Diagnosis not present

## 2020-02-29 DIAGNOSIS — M25562 Pain in left knee: Secondary | ICD-10-CM | POA: Diagnosis not present

## 2020-03-04 DIAGNOSIS — M25562 Pain in left knee: Secondary | ICD-10-CM | POA: Diagnosis not present

## 2020-03-06 DIAGNOSIS — M25562 Pain in left knee: Secondary | ICD-10-CM | POA: Diagnosis not present

## 2020-03-11 DIAGNOSIS — M25562 Pain in left knee: Secondary | ICD-10-CM | POA: Diagnosis not present

## 2020-03-19 ENCOUNTER — Ambulatory Visit (INDEPENDENT_AMBULATORY_CARE_PROVIDER_SITE_OTHER): Payer: BC Managed Care – PPO | Admitting: Orthopedic Surgery

## 2020-03-19 DIAGNOSIS — M25562 Pain in left knee: Secondary | ICD-10-CM | POA: Diagnosis not present

## 2020-03-19 DIAGNOSIS — S83512D Sprain of anterior cruciate ligament of left knee, subsequent encounter: Secondary | ICD-10-CM

## 2020-03-23 ENCOUNTER — Encounter: Payer: Self-pay | Admitting: Orthopedic Surgery

## 2020-03-23 NOTE — Progress Notes (Signed)
Post-Op Visit Note   Patient: Bethany Wade           Date of Birth: 19-Nov-1960           MRN: [id number] Visit Date: 03/19/2020 PCP: Laurey Morale, MD   Assessment & Plan:  Chief Complaint:  Chief Complaint  Patient presents with  . Left Knee - Routine Post Op   Visit Diagnoses:  1. Rupture of anterior cruciate ligament of left knee, subsequent encounter     Plan: Freddye underwent left knee ACL reconstruction 11/26/2019.  Going to therapy.  3 and half months out.  Doing the elliptical and hamstring curls and calf raises and squats.  Also doing balance exercises.  Bikes at home.  On exam she has excellent range of motion.  Mild effusion.  2 mm laxity with good endpoint.  Renal letter start doing some straightahead running at 4 months.  She did have a meniscal tear but the meniscal tear was not really unstable.  2-1/38-month return for clinical recheck.  I would hold off on cutting and pivoting exercises though until we see her back.  Follow-Up Instructions: No follow-ups on file.   Orders:  No orders of the defined types were placed in this encounter.  No orders of the defined types were placed in this encounter.   Imaging: No results found.  PMFS History: Patient Active Problem List   Diagnosis Date Noted  . HTN (hypertension) 02/13/2014  . GERD (gastroesophageal reflux disease) 03/07/2013  . Other and unspecified hyperlipidemia 07/25/2012  . Left shoulder pain 12/14/2010  . Left leg pain 12/01/2010  . MIGRAINE HEADACHE 06/06/2009  . TALIPES CAVUS 04/28/2009  . GANGLION CYST, WRIST, RIGHT 03/25/2009  . LIVER FUNCTION TESTS, ABNORMAL, HX OF 10/08/2008  . INSOMNIA 10/16/2007  . Anxiety state 08/16/2006  . POSITIVE PPD 08/16/2006  . COLONIC POLYPS, BENIGN, HX OF 08/16/2006   Past Medical History:  Diagnosis Date  . Anxiety   . GERD (gastroesophageal reflux disease)   . Hypertension   . Insomnia     Family History  Problem Relation Age of Onset  . Heart  attack Father 54       mi; several brothers with SCD  . Diabetes Father   . Hyperlipidemia Father   . Sudden death Father   . Heart disease Father   . Hypertension Father   . Heart attack Sister 34  . Hyperlipidemia Sister   . Coronary artery disease Other        fm hx  . Diabetes Other        fm hx  . Hyperlipidemia Other        fm hx  . Hypertension Other        fm hx  . Stroke Other        <60 fm hx  . Diabetes Mother   . Hyperlipidemia Mother   . Heart disease Mother   . Hypertension Mother   . Hyperlipidemia Brother   . Stroke Brother 68  . Diabetes Paternal Aunt   . Heart attack Paternal Aunt   . Hyperlipidemia Paternal Aunt   . Hypertension Paternal Aunt   . Sudden death Paternal Aunt   . Diabetes Paternal Uncle   . Heart attack Paternal Uncle   . Hyperlipidemia Paternal Uncle   . Hypertension Paternal Uncle   . Sudden death Paternal Uncle   . Diabetes Maternal Grandfather   . Sudden death Maternal Grandfather   . Hypertension Maternal Grandfather   .  Hyperlipidemia Maternal Grandfather   . Heart attack Maternal Grandfather   . Diabetes Paternal Grandmother   . Sudden death Paternal Grandmother   . Hyperlipidemia Paternal Grandmother   . Heart attack Paternal Grandmother   . Hypertension Paternal Grandmother   . Diabetes Paternal Grandfather   . Sudden death Paternal Grandfather   . Heart attack Paternal Grandfather   . Hyperlipidemia Paternal Grandfather   . Hypertension Paternal Grandfather   . Colon cancer Neg Hx   . Esophageal cancer Neg Hx   . Rectal cancer Neg Hx   . Stomach cancer Neg Hx     Past Surgical History:  Procedure Laterality Date  . ANTERIOR CRUCIATE LIGAMENT REPAIR Left 11/26/2019   Procedure: LEFT KNEE RECONSTRUCTION ANTERIOR CRUCIATE LIGAMENT (ACL) WITH HAMSTRING GRAFT, MEDIAL MENISCAL REPAIR;  Surgeon: Meredith Pel, MD;  Location: [place];  Service: Orthopedics;  Laterality: Left;  . APPENDECTOMY    .  CESAREAN SECTION     x2  . COLONOSCOPY  05-10-13   per Dr. Henrene Pastor, sessile serrated polyp, repeat in 5 yrs   . MENISCUS REPAIR Left 11/26/2019   Procedure: REPAIR OF MENISCUS;  Surgeon: Meredith Pel, MD;  Location: [place];  Service: Orthopedics;  Laterality: Left;   Social History   Occupational History  . Not on file  Tobacco Use  . Smoking status: Never Smoker  . Smokeless tobacco: Never Used  Vaping Use  . Vaping Use: Never used  Substance and Sexual Activity  . Alcohol use: Yes    Alcohol/week: 7.0 standard drinks    Types: 7 Glasses of wine per week  . Drug use: No  . Sexual activity: Not on file

## 2020-04-01 DIAGNOSIS — Z13820 Encounter for screening for osteoporosis: Secondary | ICD-10-CM | POA: Diagnosis not present

## 2020-04-01 DIAGNOSIS — Z01419 Encounter for gynecological examination (general) (routine) without abnormal findings: Secondary | ICD-10-CM | POA: Diagnosis not present

## 2020-04-01 DIAGNOSIS — Z1231 Encounter for screening mammogram for malignant neoplasm of breast: Secondary | ICD-10-CM | POA: Diagnosis not present

## 2020-04-01 DIAGNOSIS — Z6823 Body mass index (BMI) 23.0-23.9, adult: Secondary | ICD-10-CM | POA: Diagnosis not present

## 2020-04-02 ENCOUNTER — Other Ambulatory Visit: Payer: Self-pay | Admitting: Family Medicine

## 2020-04-02 NOTE — Telephone Encounter (Signed)
Last office visit- 11/02/2019 Last refill- 10/03/2019--60 capsules 5 refills, two capsules qhs  Next appointment- CPE-04/09/2020

## 2020-04-02 NOTE — Telephone Encounter (Signed)
Pt is calling in stating that she is out of Rx Temazepam (RESTORIL) 15 MG  Pharm:  [facility] on [address].

## 2020-04-03 DIAGNOSIS — M25562 Pain in left knee: Secondary | ICD-10-CM | POA: Diagnosis not present

## 2020-04-09 ENCOUNTER — Encounter: Payer: Self-pay | Admitting: Family Medicine

## 2020-04-09 ENCOUNTER — Other Ambulatory Visit: Payer: Self-pay

## 2020-04-09 ENCOUNTER — Ambulatory Visit (INDEPENDENT_AMBULATORY_CARE_PROVIDER_SITE_OTHER): Payer: BC Managed Care – PPO | Admitting: Family Medicine

## 2020-04-09 VITALS — BP 130/82 | HR 70 | Temp 98.6°F | Ht 63.0 in | Wt 130.0 lb

## 2020-04-09 DIAGNOSIS — Z0000 Encounter for general adult medical examination without abnormal findings: Secondary | ICD-10-CM

## 2020-04-09 LAB — CBC WITH DIFFERENTIAL/PLATELET
Basophils Absolute: 0 10*3/uL (ref 0.0–0.1)
Basophils Relative: 0.3 % (ref 0.0–3.0)
Eosinophils Absolute: 0.1 10*3/uL (ref 0.0–0.7)
Eosinophils Relative: 2.9 % (ref 0.0–5.0)
HCT: 43.3 % (ref 36.0–46.0)
Hemoglobin: 14.3 g/dL (ref 12.0–15.0)
Lymphocytes Relative: 41.4 % (ref 12.0–46.0)
Lymphs Abs: 1.7 10*3/uL (ref 0.7–4.0)
MCHC: 33 g/dL (ref 30.0–36.0)
MCV: 85.4 fl (ref 78.0–100.0)
Monocytes Absolute: 0.2 10*3/uL (ref 0.1–1.0)
Monocytes Relative: 5.8 % (ref 3.0–12.0)
Neutro Abs: 2 10*3/uL (ref 1.4–7.7)
Neutrophils Relative %: 49.6 % (ref 43.0–77.0)
Platelets: 211 10*3/uL (ref 150.0–400.0)
RBC: 5.07 Mil/uL (ref 3.87–5.11)
RDW: 13.9 % (ref 11.5–15.5)
WBC: 4 10*3/uL (ref 4.0–10.5)

## 2020-04-09 LAB — HEPATIC FUNCTION PANEL
ALT: 15 U/L (ref 0–35)
AST: 22 U/L (ref 0–37)
Albumin: 4.6 g/dL (ref 3.5–5.2)
Alkaline Phosphatase: 71 U/L (ref 39–117)
Bilirubin, Direct: 0.1 mg/dL (ref 0.0–0.3)
Total Bilirubin: 0.5 mg/dL (ref 0.2–1.2)
Total Protein: 7.6 g/dL (ref 6.0–8.3)

## 2020-04-09 LAB — BASIC METABOLIC PANEL
BUN: 19 mg/dL (ref 6–23)
CO2: 31 mEq/L (ref 19–32)
Calcium: 9.7 mg/dL (ref 8.4–10.5)
Chloride: 103 mEq/L (ref 96–112)
Creatinine, Ser: 0.73 mg/dL (ref 0.40–1.20)
GFR: 90.74 mL/min (ref >60.00)
Glucose, Bld: 86 mg/dL (ref 70–99)
Potassium: 4.5 mEq/L (ref 3.5–5.1)
Sodium: 140 mEq/L (ref 135–145)

## 2020-04-09 LAB — LIPID PANEL
Cholesterol: 204 mg/dL — ABNORMAL HIGH (ref 0–200)
HDL: 67.6 mg/dL (ref >39.00)
LDL Cholesterol: 123 mg/dL — ABNORMAL HIGH (ref 0–99)
NonHDL: 136.54
Total CHOL/HDL Ratio: 3
Triglycerides: 69 mg/dL (ref 0.0–149.0)
VLDL: 13.8 mg/dL (ref 0.0–40.0)

## 2020-04-09 LAB — TSH: TSH: 1.8 u[IU]/mL (ref 0.35–4.50)

## 2020-04-09 MED ORDER — SERTRALINE HCL 25 MG PO TABS: 25.0000 mg | ORAL_TABLET | Freq: Every day | ORAL | 3 refills | Status: DC

## 2020-04-09 NOTE — Progress Notes (Signed)
   Subjective:    Patient ID: Bethany Wade, female    DOB: [birth year], 60 y.o.   MRN: [id number]  HPI Here for a well exam. She feels well. She had surgery on the left knee last November and this went well. She is past due for a colonoscopy. Her anxiety has been stable and she asks to go on a lower dose of Zoloft if possible.  Review of Systems  Constitutional: Negative.   HENT: Negative.   Eyes: Negative.   Respiratory: Negative.   Cardiovascular: Negative.   Gastrointestinal: Negative.   Genitourinary: Negative for decreased urine volume, difficulty urinating, dyspareunia, dysuria, enuresis, flank pain, frequency, hematuria, pelvic pain and urgency.  Musculoskeletal: Negative.   Skin: Negative.   Neurological: Negative.   Psychiatric/Behavioral: Negative.        Objective:   Physical Exam Constitutional:      General: She is not in acute distress.    Appearance: Normal appearance. She is well-developed.  HENT:     Head: Normocephalic and atraumatic.     Right Ear: External ear normal.     Left Ear: External ear normal.     Nose: Nose normal.     Mouth/Throat:     Pharynx: No oropharyngeal exudate.  Eyes:     General: No scleral icterus.    Conjunctiva/sclera: Conjunctivae normal.     Pupils: Pupils are equal, round, and reactive to light.  Neck:     Thyroid: No thyromegaly.     Vascular: No JVD.  Cardiovascular:     Rate and Rhythm: Normal rate and regular rhythm.     Heart sounds: Normal heart sounds. No murmur heard. No friction rub. No gallop.   Pulmonary:     Effort: Pulmonary effort is normal. No respiratory distress.     Breath sounds: Normal breath sounds. No wheezing or rales.  Chest:     Chest wall: No tenderness.  Abdominal:     General: Bowel sounds are normal. There is no distension.     Palpations: Abdomen is soft. There is no mass.     Tenderness: There is no abdominal tenderness. There is no guarding or rebound.  Musculoskeletal:         General: No tenderness. Normal range of motion.     Cervical back: Normal range of motion and neck supple.  Lymphadenopathy:     Cervical: No cervical adenopathy.  Skin:    General: Skin is warm and dry.     Findings: No erythema or rash.  Neurological:     Mental Status: She is alert and oriented to person, place, and time.     Cranial Nerves: No cranial nerve deficit.     Motor: No abnormal muscle tone.     Coordination: Coordination normal.     Deep Tendon Reflexes: Reflexes are normal and symmetric. Reflexes normal.  Psychiatric:        Behavior: Behavior normal.        Thought Content: Thought content normal.        Judgment: Judgment normal.           Assessment & Plan:  Well exam. We discussed diet and exercise. Get fasting labs. Set up a colonoscopy. For the anxiety, we wil decrease the Zoloft to 25 mg daily.  Alysia Penna, MD

## 2020-04-11 ENCOUNTER — Ambulatory Visit (INDEPENDENT_AMBULATORY_CARE_PROVIDER_SITE_OTHER): Payer: BC Managed Care – PPO | Admitting: Orthopedic Surgery

## 2020-04-11 ENCOUNTER — Other Ambulatory Visit: Payer: Self-pay

## 2020-04-11 ENCOUNTER — Ambulatory Visit (INDEPENDENT_AMBULATORY_CARE_PROVIDER_SITE_OTHER): Payer: BC Managed Care – PPO

## 2020-04-11 DIAGNOSIS — M25562 Pain in left knee: Secondary | ICD-10-CM

## 2020-04-11 MED ORDER — MELOXICAM 15 MG PO TABS: 15.0000 mg | ORAL_TABLET | Freq: Every day | ORAL | 0 refills | Status: DC

## 2020-04-13 ENCOUNTER — Encounter: Payer: Self-pay | Admitting: Orthopedic Surgery

## 2020-04-13 NOTE — Progress Notes (Signed)
Post-Op Visit Note   Patient: Bethany Wade           Date of Birth: May 21, 1960           MRN: [id number] Visit Date: 04/11/2020 PCP: Laurey Morale, MD   Assessment & Plan:  Chief Complaint:  Chief Complaint  Patient presents with  . Left Knee - Pain   Visit Diagnoses:  1. Left knee pain, unspecified chronicity     Plan: [name] is a 60 year old patient who is about 2 months out left knee ACL reconstruction and meniscal repair on the medial side.  Has been doing a lot of walking and activity.  Had some swelling in the left knee.  On exam she does have a moderate effusion.  Graft is stable.  Not much in terms of discrete joint line tenderness.  Aspirated about 40 cc out today.  Fluid is clear yellow.  No fevers and chills.  Put her on Mobic for 2 weeks and then clinical recheck in 2 to 4 weeks to see how she is doing.  I do want her to hold off on a lot of loadbearing exercises for now and focus on the bike.  Do that for about 2 weeks and then resume more loadbearing types of activities.  We will see where she is at after that.  Follow-Up Instructions: Return in about 4 weeks (around 05/09/2020).   Orders:  Orders Placed This Encounter  Procedures  . XR Knee 1-2 Views Left   Meds ordered this encounter  Medications  . meloxicam (MOBIC) 15 MG tablet    Sig: Take 1 tablet (15 mg total) by mouth daily.    Dispense:  30 tablet    Refill:  0    Imaging: No results found.  PMFS History: Patient Active Problem List   Diagnosis Date Noted  . HTN (hypertension) 02/13/2014  . GERD (gastroesophageal reflux disease) 03/07/2013  . Other and unspecified hyperlipidemia 07/25/2012  . Left shoulder pain 12/14/2010  . Left leg pain 12/01/2010  . MIGRAINE HEADACHE 06/06/2009  . TALIPES CAVUS 04/28/2009  . GANGLION CYST, WRIST, RIGHT 03/25/2009  . LIVER FUNCTION TESTS, ABNORMAL, HX OF 10/08/2008  . INSOMNIA 10/16/2007  . Anxiety state 08/16/2006  . POSITIVE PPD 08/16/2006  .  COLONIC POLYPS, BENIGN, HX OF 08/16/2006   Past Medical History:  Diagnosis Date  . Anxiety   . GERD (gastroesophageal reflux disease)   . Hypertension   . Insomnia     Family History  Problem Relation Age of Onset  . Heart attack Father 35       mi; several brothers with SCD  . Diabetes Father   . Hyperlipidemia Father   . Sudden death Father   . Heart disease Father   . Hypertension Father   . Heart attack Sister 33  . Hyperlipidemia Sister   . Coronary artery disease Other        fm hx  . Diabetes Other        fm hx  . Hyperlipidemia Other        fm hx  . Hypertension Other        fm hx  . Stroke Other        <60 fm hx  . Diabetes Mother   . Hyperlipidemia Mother   . Heart disease Mother   . Hypertension Mother   . Hyperlipidemia Brother   . Stroke Brother 80  . Diabetes Paternal Aunt   . Heart attack Paternal  Aunt   . Hyperlipidemia Paternal Aunt   . Hypertension Paternal Aunt   . Sudden death Paternal Aunt   . Diabetes Paternal Uncle   . Heart attack Paternal Uncle   . Hyperlipidemia Paternal Uncle   . Hypertension Paternal Uncle   . Sudden death Paternal Uncle   . Diabetes Maternal Grandfather   . Sudden death Maternal Grandfather   . Hypertension Maternal Grandfather   . Hyperlipidemia Maternal Grandfather   . Heart attack Maternal Grandfather   . Diabetes Paternal Grandmother   . Sudden death Paternal Grandmother   . Hyperlipidemia Paternal Grandmother   . Heart attack Paternal Grandmother   . Hypertension Paternal Grandmother   . Diabetes Paternal Grandfather   . Sudden death Paternal Grandfather   . Heart attack Paternal Grandfather   . Hyperlipidemia Paternal Grandfather   . Hypertension Paternal Grandfather   . Colon cancer Neg Hx   . Esophageal cancer Neg Hx   . Rectal cancer Neg Hx   . Stomach cancer Neg Hx     Past Surgical History:  Procedure Laterality Date  . ANTERIOR CRUCIATE LIGAMENT REPAIR Left 11/26/2019   Procedure: LEFT KNEE  RECONSTRUCTION ANTERIOR CRUCIATE LIGAMENT (ACL) WITH HAMSTRING GRAFT, MEDIAL MENISCAL REPAIR;  Surgeon: Meredith Pel, MD;  Location: [place];  Service: Orthopedics;  Laterality: Left;  . APPENDECTOMY    . CESAREAN SECTION     x2  . COLONOSCOPY  05-10-13   per Dr. Henrene Pastor, sessile serrated polyp, repeat in 5 yrs   . MENISCUS REPAIR Left 11/26/2019   Procedure: REPAIR OF MENISCUS;  Surgeon: Meredith Pel, MD;  Location: [place];  Service: Orthopedics;  Laterality: Left;   Social History   Occupational History  . Not on file  Tobacco Use  . Smoking status: Never Smoker  . Smokeless tobacco: Never Used  Vaping Use  . Vaping Use: Never used  Substance and Sexual Activity  . Alcohol use: Yes    Alcohol/week: 7.0 standard drinks    Types: 7 Glasses of wine per week  . Drug use: No  . Sexual activity: Not on file

## 2020-04-16 DIAGNOSIS — M8580 Other specified disorders of bone density and structure, unspecified site: Secondary | ICD-10-CM | POA: Diagnosis not present

## 2020-04-25 ENCOUNTER — Other Ambulatory Visit: Payer: BC Managed Care – PPO

## 2020-04-30 DIAGNOSIS — M25562 Pain in left knee: Secondary | ICD-10-CM | POA: Diagnosis not present

## 2020-05-05 ENCOUNTER — Ambulatory Visit (INDEPENDENT_AMBULATORY_CARE_PROVIDER_SITE_OTHER): Payer: BC Managed Care – PPO | Admitting: Orthopedic Surgery

## 2020-05-05 ENCOUNTER — Other Ambulatory Visit: Payer: Self-pay

## 2020-05-05 DIAGNOSIS — M25562 Pain in left knee: Secondary | ICD-10-CM

## 2020-05-07 DIAGNOSIS — M25562 Pain in left knee: Secondary | ICD-10-CM | POA: Diagnosis not present

## 2020-05-08 ENCOUNTER — Encounter: Payer: Self-pay | Admitting: Orthopedic Surgery

## 2020-05-08 NOTE — Progress Notes (Signed)
Post-Op Visit Note   Patient: Bethany Wade           Date of Birth: 1960-08-21           MRN: [id number] Visit Date: 05/05/2020 PCP: Laurey Morale, MD   Assessment & Plan:  Chief Complaint:  Chief Complaint  Patient presents with  . Left Knee - Follow-up   Visit Diagnoses:  1. Left knee pain, unspecified chronicity     Plan: Bethany Wade is a 60 year old patient left knee ACL reconstruction with hamstring autograft and medial meniscal repair.  Had left knee aspiration 04/11/2020.  Doing some better.  Swelling has improved.  She has been doing the bike.  Doing some lateral movement.  She is on Mobic.  She wants to get back to being active at the gym so she can exercise.  On examination she has excellent range of motion.  Effusion is mild.  Graft is stable.  Do not feel a definite clicking on the medial side and there is no focal medial joint line tenderness.  Concern at this time would be for possible delayed healing of that meniscal repair.  No real mechanical symptoms are present but the persistent effusion could indicate that the meniscus has not repaired biologically fully yet.  Plan at this time is to let her try to resume some of the gym type activities with the exception of cutting and pivoting exercises.  Come back in 6 weeks for recheck on effusion.  Follow-Up Instructions: Return in about 6 weeks (around 06/16/2020).   Orders:  No orders of the defined types were placed in this encounter.  No orders of the defined types were placed in this encounter.   Imaging: No results found.  PMFS History: Patient Active Problem List   Diagnosis Date Noted  . HTN (hypertension) 02/13/2014  . GERD (gastroesophageal reflux disease) 03/07/2013  . Other and unspecified hyperlipidemia 07/25/2012  . Left shoulder pain 12/14/2010  . Left leg pain 12/01/2010  . MIGRAINE HEADACHE 06/06/2009  . TALIPES CAVUS 04/28/2009  . GANGLION CYST, WRIST, RIGHT 03/25/2009  . LIVER FUNCTION  TESTS, ABNORMAL, HX OF 10/08/2008  . INSOMNIA 10/16/2007  . Anxiety state 08/16/2006  . POSITIVE PPD 08/16/2006  . COLONIC POLYPS, BENIGN, HX OF 08/16/2006   Past Medical History:  Diagnosis Date  . Anxiety   . GERD (gastroesophageal reflux disease)   . Hypertension   . Insomnia     Family History  Problem Relation Age of Onset  . Heart attack Father 88       mi; several brothers with SCD  . Diabetes Father   . Hyperlipidemia Father   . Sudden death Father   . Heart disease Father   . Hypertension Father   . Heart attack Sister 61  . Hyperlipidemia Sister   . Coronary artery disease Other        fm hx  . Diabetes Other        fm hx  . Hyperlipidemia Other        fm hx  . Hypertension Other        fm hx  . Stroke Other        <60 fm hx  . Diabetes Mother   . Hyperlipidemia Mother   . Heart disease Mother   . Hypertension Mother   . Hyperlipidemia Brother   . Stroke Brother 20  . Diabetes Paternal Aunt   . Heart attack Paternal Aunt   . Hyperlipidemia Paternal Aunt   .  Hypertension Paternal Aunt   . Sudden death Paternal Aunt   . Diabetes Paternal Uncle   . Heart attack Paternal Uncle   . Hyperlipidemia Paternal Uncle   . Hypertension Paternal Uncle   . Sudden death Paternal Uncle   . Diabetes Maternal Grandfather   . Sudden death Maternal Grandfather   . Hypertension Maternal Grandfather   . Hyperlipidemia Maternal Grandfather   . Heart attack Maternal Grandfather   . Diabetes Paternal Grandmother   . Sudden death Paternal Grandmother   . Hyperlipidemia Paternal Grandmother   . Heart attack Paternal Grandmother   . Hypertension Paternal Grandmother   . Diabetes Paternal Grandfather   . Sudden death Paternal Grandfather   . Heart attack Paternal Grandfather   . Hyperlipidemia Paternal Grandfather   . Hypertension Paternal Grandfather   . Colon cancer Neg Hx   . Esophageal cancer Neg Hx   . Rectal cancer Neg Hx   . Stomach cancer Neg Hx     Past  Surgical History:  Procedure Laterality Date  . ANTERIOR CRUCIATE LIGAMENT REPAIR Left 11/26/2019   Procedure: LEFT KNEE RECONSTRUCTION ANTERIOR CRUCIATE LIGAMENT (ACL) WITH HAMSTRING GRAFT, MEDIAL MENISCAL REPAIR;  Surgeon: Meredith Pel, MD;  Location: [facility];  Service: Orthopedics;  Laterality: Left;  . APPENDECTOMY    . CESAREAN SECTION     x2  . COLONOSCOPY  05-10-13   per Dr. Henrene Pastor, sessile serrated polyp, repeat in 5 yrs   . MENISCUS REPAIR Left 11/26/2019   Procedure: REPAIR OF MENISCUS;  Surgeon: Meredith Pel, MD;  Location: [facility];  Service: Orthopedics;  Laterality: Left;   Social History   Occupational History  . Not on file  Tobacco Use  . Smoking status: Never Smoker  . Smokeless tobacco: Never Used  Vaping Use  . Vaping Use: Never used  Substance and Sexual Activity  . Alcohol use: Yes    Alcohol/week: 7.0 standard drinks    Types: 7 Glasses of wine per week  . Drug use: No  . Sexual activity: Not on file

## 2020-05-16 ENCOUNTER — Ambulatory Visit: Payer: BC Managed Care – PPO | Admitting: Orthopedic Surgery

## 2020-06-03 ENCOUNTER — Other Ambulatory Visit: Payer: Self-pay | Admitting: Family Medicine

## 2020-06-03 ENCOUNTER — Other Ambulatory Visit: Payer: Self-pay | Admitting: Orthopedic Surgery

## 2020-06-03 NOTE — Telephone Encounter (Signed)
Pls advise.  

## 2020-06-18 ENCOUNTER — Ambulatory Visit: Payer: BC Managed Care – PPO | Admitting: Orthopedic Surgery

## 2020-07-07 ENCOUNTER — Ambulatory Visit: Payer: BC Managed Care – PPO | Admitting: Orthopedic Surgery

## 2020-07-29 ENCOUNTER — Other Ambulatory Visit: Payer: Self-pay | Admitting: Family Medicine

## 2020-08-06 ENCOUNTER — Ambulatory Visit: Payer: BC Managed Care – PPO | Admitting: Surgical

## 2020-09-30 ENCOUNTER — Telehealth: Payer: Self-pay | Admitting: Family Medicine

## 2020-09-30 ENCOUNTER — Other Ambulatory Visit: Payer: Self-pay | Admitting: Family Medicine

## 2020-09-30 MED ORDER — TEMAZEPAM 15 MG PO CAPS: ORAL_CAPSULE | ORAL | 5 refills | Status: DC

## 2020-09-30 NOTE — Telephone Encounter (Signed)
Done

## 2020-09-30 NOTE — Telephone Encounter (Signed)
Patient is requesting for temazepam (RESTORIL) 15 MG capsule [id number]  to be refilled.  Pharmacy is [facility] [address], [place] [id number]   Please advise.

## 2020-09-30 NOTE — Telephone Encounter (Signed)
Pt LOV was 04/09/2020 Last refill done on 04/02/2020 Please advise

## 2020-09-30 NOTE — Telephone Encounter (Signed)
Called patient to inform prescription has been sent to pharmacy

## 2020-10-27 ENCOUNTER — Other Ambulatory Visit: Payer: Self-pay | Admitting: Family Medicine

## 2020-11-04 ENCOUNTER — Other Ambulatory Visit: Payer: Self-pay

## 2020-11-05 ENCOUNTER — Ambulatory Visit: Payer: BC Managed Care – PPO | Admitting: Family Medicine

## 2020-11-05 ENCOUNTER — Encounter: Payer: Self-pay | Admitting: Family Medicine

## 2020-11-05 VITALS — BP 130/80 | HR 68 | Temp 98.4°F | Wt 134.1 lb

## 2020-11-05 DIAGNOSIS — I10 Essential (primary) hypertension: Secondary | ICD-10-CM

## 2020-11-05 NOTE — Progress Notes (Signed)
   Subjective:    Patient ID: Bethany Wade, female    DOB: [birth year], 60 y.o.   MRN: [id number]  HPI Here to follow up on BP and to ask questions about long term health insurance. She feels fine. She and her husband have applied to [employer] through the [facility], and they have reviewed  each of their past medical histories. They have some concerns about her treatment for anxiety, for the cardiologic workup she had with Dr. Candee Furbish, and about her short term use of Oxycodone when she had her knee surgery last year.    Review of Systems  Constitutional: Negative.   Respiratory: Negative.    Cardiovascular: Negative.   Psychiatric/Behavioral: Negative.        Objective:   Physical Exam Constitutional:      Appearance: Normal appearance.  Cardiovascular:     Rate and Rhythm: Normal rate and regular rhythm.     Pulses: Normal pulses.     Heart sounds: Normal heart sounds.  Pulmonary:     Effort: Pulmonary effort is normal.     Breath sounds: Normal breath sounds.  Neurological:     Mental Status: She is alert.  Psychiatric:        Mood and Affect: Mood normal.        Behavior: Behavior normal.        Thought Content: Thought content normal.          Assessment & Plan:  Her HTN is well controlled. Her insurance company will fax [place] some forms to fill out to certify she is in good health. I told her we would be happy to take care of this for her.  Alysia Penna, MD

## 2020-11-20 DIAGNOSIS — M545 Low back pain, unspecified: Secondary | ICD-10-CM | POA: Diagnosis not present

## 2020-11-24 DIAGNOSIS — M545 Low back pain, unspecified: Secondary | ICD-10-CM | POA: Diagnosis not present

## 2020-11-26 DIAGNOSIS — M545 Low back pain, unspecified: Secondary | ICD-10-CM | POA: Diagnosis not present

## 2020-11-28 DIAGNOSIS — M545 Low back pain, unspecified: Secondary | ICD-10-CM | POA: Diagnosis not present

## 2020-12-01 DIAGNOSIS — M545 Low back pain, unspecified: Secondary | ICD-10-CM | POA: Diagnosis not present

## 2020-12-03 DIAGNOSIS — M545 Low back pain, unspecified: Secondary | ICD-10-CM | POA: Diagnosis not present

## 2020-12-05 ENCOUNTER — Encounter: Payer: Self-pay | Admitting: Family Medicine

## 2020-12-05 ENCOUNTER — Ambulatory Visit: Payer: BC Managed Care – PPO | Admitting: Family Medicine

## 2020-12-05 VITALS — BP 132/88 | HR 90 | Temp 98.6°F | Wt 134.2 lb

## 2020-12-05 DIAGNOSIS — S00412A Abrasion of left ear, initial encounter: Secondary | ICD-10-CM

## 2020-12-05 DIAGNOSIS — M545 Low back pain, unspecified: Secondary | ICD-10-CM | POA: Diagnosis not present

## 2020-12-05 NOTE — Progress Notes (Signed)
   Subjective:    Patient ID: Bethany Wade, female    DOB: 03/11/1960, 60 y.o.   MRN: [id number]  HPI Here for bleeding from the left ear that started this morning. No trauma that she knows of, but she admits to cleaning her ears with QTips. There is no pain.    Review of Systems  Constitutional: Negative.   HENT:  Negative for congestion, ear pain, postnasal drip, sinus pressure and sore throat.   Eyes: Negative.   Respiratory: Negative.        Objective:   Physical Exam Constitutional:      Appearance: Normal appearance.  HENT:     Right Ear: Tympanic membrane, ear canal and external ear normal.     Left Ear: Tympanic membrane normal.     Ears:     Comments: Left ear canal has a small amount of clotted blood in the inferior portion just inside the opening    Nose: Nose normal.     Mouth/Throat:     Pharynx: Oropharynx is clear.  Eyes:     Conjunctiva/sclera: Conjunctivae normal.  Cardiovascular:     Rate and Rhythm: Normal rate and regular rhythm.     Pulses: Normal pulses.     Heart sounds: Normal heart sounds.  Pulmonary:     Effort: Pulmonary effort is normal.     Breath sounds: Normal breath sounds.  Lymphadenopathy:     Cervical: No cervical adenopathy.  Neurological:     Mental Status: She is alert.          Assessment & Plan:  Ear canal abrasion. This will heal on its own. I advised her to never use Q Tips or other objects in the ear canals.  Alysia Penna, MD

## 2021-01-25 ENCOUNTER — Other Ambulatory Visit: Payer: Self-pay | Admitting: Family Medicine

## 2021-03-02 DIAGNOSIS — M9904 Segmental and somatic dysfunction of sacral region: Secondary | ICD-10-CM | POA: Diagnosis not present

## 2021-03-02 DIAGNOSIS — M9902 Segmental and somatic dysfunction of thoracic region: Secondary | ICD-10-CM | POA: Diagnosis not present

## 2021-03-02 DIAGNOSIS — M9901 Segmental and somatic dysfunction of cervical region: Secondary | ICD-10-CM | POA: Diagnosis not present

## 2021-03-02 DIAGNOSIS — M9903 Segmental and somatic dysfunction of lumbar region: Secondary | ICD-10-CM | POA: Diagnosis not present

## 2021-03-05 DIAGNOSIS — M9901 Segmental and somatic dysfunction of cervical region: Secondary | ICD-10-CM | POA: Diagnosis not present

## 2021-03-05 DIAGNOSIS — M9902 Segmental and somatic dysfunction of thoracic region: Secondary | ICD-10-CM | POA: Diagnosis not present

## 2021-03-05 DIAGNOSIS — M9904 Segmental and somatic dysfunction of sacral region: Secondary | ICD-10-CM | POA: Diagnosis not present

## 2021-03-05 DIAGNOSIS — M9903 Segmental and somatic dysfunction of lumbar region: Secondary | ICD-10-CM | POA: Diagnosis not present

## 2021-03-09 DIAGNOSIS — M9902 Segmental and somatic dysfunction of thoracic region: Secondary | ICD-10-CM | POA: Diagnosis not present

## 2021-03-09 DIAGNOSIS — M9903 Segmental and somatic dysfunction of lumbar region: Secondary | ICD-10-CM | POA: Diagnosis not present

## 2021-03-09 DIAGNOSIS — M9901 Segmental and somatic dysfunction of cervical region: Secondary | ICD-10-CM | POA: Diagnosis not present

## 2021-03-09 DIAGNOSIS — M9904 Segmental and somatic dysfunction of sacral region: Secondary | ICD-10-CM | POA: Diagnosis not present

## 2021-03-12 DIAGNOSIS — M9903 Segmental and somatic dysfunction of lumbar region: Secondary | ICD-10-CM | POA: Diagnosis not present

## 2021-03-12 DIAGNOSIS — M9901 Segmental and somatic dysfunction of cervical region: Secondary | ICD-10-CM | POA: Diagnosis not present

## 2021-03-12 DIAGNOSIS — M9902 Segmental and somatic dysfunction of thoracic region: Secondary | ICD-10-CM | POA: Diagnosis not present

## 2021-03-12 DIAGNOSIS — M9904 Segmental and somatic dysfunction of sacral region: Secondary | ICD-10-CM | POA: Diagnosis not present

## 2021-03-19 DIAGNOSIS — M9902 Segmental and somatic dysfunction of thoracic region: Secondary | ICD-10-CM | POA: Diagnosis not present

## 2021-03-19 DIAGNOSIS — M9903 Segmental and somatic dysfunction of lumbar region: Secondary | ICD-10-CM | POA: Diagnosis not present

## 2021-03-19 DIAGNOSIS — M9901 Segmental and somatic dysfunction of cervical region: Secondary | ICD-10-CM | POA: Diagnosis not present

## 2021-03-19 DIAGNOSIS — M9904 Segmental and somatic dysfunction of sacral region: Secondary | ICD-10-CM | POA: Diagnosis not present

## 2021-04-02 DIAGNOSIS — M9904 Segmental and somatic dysfunction of sacral region: Secondary | ICD-10-CM | POA: Diagnosis not present

## 2021-04-02 DIAGNOSIS — M9901 Segmental and somatic dysfunction of cervical region: Secondary | ICD-10-CM | POA: Diagnosis not present

## 2021-04-02 DIAGNOSIS — M9903 Segmental and somatic dysfunction of lumbar region: Secondary | ICD-10-CM | POA: Diagnosis not present

## 2021-04-02 DIAGNOSIS — M9902 Segmental and somatic dysfunction of thoracic region: Secondary | ICD-10-CM | POA: Diagnosis not present

## 2021-04-08 ENCOUNTER — Other Ambulatory Visit: Payer: Self-pay | Admitting: Family Medicine

## 2021-04-08 ENCOUNTER — Telehealth: Payer: Self-pay | Admitting: Family Medicine

## 2021-04-08 NOTE — Telephone Encounter (Signed)
Patient called in requesting a refill for temazepam (RESTORIL) 15 MG capsule [[id number]]  to be sent to her pharmacy. ? ?Please advise. ?

## 2021-04-09 ENCOUNTER — Telehealth: Payer: Self-pay

## 2021-04-09 MED ORDER — TEMAZEPAM 15 MG PO CAPS: ORAL_CAPSULE | ORAL | 5 refills | Status: DC

## 2021-04-09 NOTE — Telephone Encounter (Signed)
Done

## 2021-04-09 NOTE — Telephone Encounter (Signed)
Patient called asking for Rx because she is has not slept and would like Rx today before 2pm ?temazepam (RESTORIL) 15 MG capsule ?

## 2021-04-09 NOTE — Telephone Encounter (Addendum)
Patient called office on today requesting medication to be sent by 2pm.   Patient stated that she hasn't slept. ? ?Last refill-09/30/2020--60 capsules, 5 refill ?Last OV-12/05/2020 ? ?No future OV scheduled. ? ? ?

## 2021-04-09 NOTE — Telephone Encounter (Signed)
Called patient to inform prescription has been sent.  

## 2021-04-09 NOTE — Telephone Encounter (Signed)
Pt called on yesterday requesting medication.   Previous message has been sent ?

## 2021-04-16 DIAGNOSIS — M9902 Segmental and somatic dysfunction of thoracic region: Secondary | ICD-10-CM | POA: Diagnosis not present

## 2021-04-16 DIAGNOSIS — M9904 Segmental and somatic dysfunction of sacral region: Secondary | ICD-10-CM | POA: Diagnosis not present

## 2021-04-16 DIAGNOSIS — M9901 Segmental and somatic dysfunction of cervical region: Secondary | ICD-10-CM | POA: Diagnosis not present

## 2021-04-16 DIAGNOSIS — M9903 Segmental and somatic dysfunction of lumbar region: Secondary | ICD-10-CM | POA: Diagnosis not present

## 2021-05-04 ENCOUNTER — Other Ambulatory Visit: Payer: Self-pay | Admitting: Family Medicine

## 2021-05-07 DIAGNOSIS — M5442 Lumbago with sciatica, left side: Secondary | ICD-10-CM | POA: Diagnosis not present

## 2021-05-07 DIAGNOSIS — M9903 Segmental and somatic dysfunction of lumbar region: Secondary | ICD-10-CM | POA: Diagnosis not present

## 2021-05-07 DIAGNOSIS — M9904 Segmental and somatic dysfunction of sacral region: Secondary | ICD-10-CM | POA: Diagnosis not present

## 2021-05-14 ENCOUNTER — Encounter: Payer: Self-pay | Admitting: Internal Medicine

## 2021-05-19 ENCOUNTER — Ambulatory Visit (AMBULATORY_SURGERY_CENTER): Payer: BC Managed Care – PPO | Admitting: *Deleted

## 2021-05-19 VITALS — Ht 63.0 in | Wt 136.0 lb

## 2021-05-19 DIAGNOSIS — Z86010 Personal history of colonic polyps: Secondary | ICD-10-CM

## 2021-05-19 MED ORDER — NA SULFATE-K SULFATE-MG SULF 17.5-3.13-1.6 GM/177ML PO SOLN: 2.0000 | Freq: Once | ORAL | 0 refills | Status: AC

## 2021-05-19 NOTE — Progress Notes (Signed)
No egg or soy allergy known to patient  ?No issues known to pt with past sedation with any surgeries or procedures ?Patient denies ever being told they had issues or difficulty with intubation  ?No FH of Malignant Hyperthermia ?Pt is not on diet pills ?Pt is not on  home 02  ?Pt is not on blood thinners  ?Pt denies issues with constipation  ?No A fib or A flutter ?Pt instructed to use Singlecare.com or GoodRx for a price reduction on prep  ? ?PV completed over the phone. Pt verified name, DOB, address and insurance during PV today.  ?Pt mailed instruction packet with copy of consent form to read and not return, and instructions.  ?Pt encouraged to call with questions or issues.  ?If pt has My chart, procedure instructions sent via My Chart  ?Insurance confirmed with pt at [facility] today   ?

## 2021-06-01 ENCOUNTER — Encounter: Payer: Self-pay | Admitting: Internal Medicine

## 2021-06-04 DIAGNOSIS — M9905 Segmental and somatic dysfunction of pelvic region: Secondary | ICD-10-CM | POA: Diagnosis not present

## 2021-06-04 DIAGNOSIS — M9903 Segmental and somatic dysfunction of lumbar region: Secondary | ICD-10-CM | POA: Diagnosis not present

## 2021-06-04 DIAGNOSIS — M5442 Lumbago with sciatica, left side: Secondary | ICD-10-CM | POA: Diagnosis not present

## 2021-06-04 DIAGNOSIS — M9904 Segmental and somatic dysfunction of sacral region: Secondary | ICD-10-CM | POA: Diagnosis not present

## 2021-06-09 ENCOUNTER — Encounter: Payer: Self-pay | Admitting: Internal Medicine

## 2021-06-09 ENCOUNTER — Ambulatory Visit (AMBULATORY_SURGERY_CENTER): Payer: BC Managed Care – PPO | Admitting: Internal Medicine

## 2021-06-09 VITALS — BP 119/76 | HR 57 | Temp 97.5°F | Resp 11 | Ht 63.0 in | Wt 136.0 lb

## 2021-06-09 DIAGNOSIS — Z86010 Personal history of colonic polyps: Secondary | ICD-10-CM | POA: Diagnosis not present

## 2021-06-09 DIAGNOSIS — Z1211 Encounter for screening for malignant neoplasm of colon: Secondary | ICD-10-CM | POA: Diagnosis not present

## 2021-06-09 HISTORY — PX: COLONOSCOPY: SHX174

## 2021-06-09 MED ORDER — SODIUM CHLORIDE 0.9 % IV SOLN: 500.0000 mL | Freq: Once | INTRAVENOUS | Status: DC

## 2021-06-09 NOTE — Progress Notes (Signed)
Only change was osteopenia per pt.  No other changes from previsit.  Pt's states no other medical or surgical changes since previsit or office visit.  ?

## 2021-06-09 NOTE — Progress Notes (Signed)
HISTORY OF PRESENT ILLNESS: ? ?Bethany Wade is a 61 y.o. female with a history of sessile serrated polyp on colonoscopy April 2015.  Now for surveillance.  No complaints ? ?REVIEW OF SYSTEMS: ? ?All non-GI ROS negative . ? ?Past Medical History:  ?Diagnosis Date  ? Anxiety   ? GERD (gastroesophageal reflux disease)   ? Hypertension   ? Insomnia   ? Osteopenia   ? ? ?Past Surgical History:  ?Procedure Laterality Date  ? ANTERIOR CRUCIATE LIGAMENT REPAIR Left 11/26/2019  ? Procedure: LEFT KNEE RECONSTRUCTION ANTERIOR CRUCIATE LIGAMENT (ACL) WITH HAMSTRING GRAFT, MEDIAL MENISCAL REPAIR;  Surgeon: Meredith Pel, MD;  Location: [facility];  Service: Orthopedics;  Laterality: Left;  ? APPENDECTOMY    ? CESAREAN SECTION    ? x2  ? COLONOSCOPY  05-10-13  ? per Dr. Henrene Pastor, sessile serrated polyp, repeat in 5 yrs   ? MENISCUS REPAIR Left 11/26/2019  ? Procedure: REPAIR OF MENISCUS;  Surgeon: Meredith Pel, MD;  Location: [facility];  Service: Orthopedics;  Laterality: Left;  ? ? ?Social History ?Bethany Wade  reports that she has never smoked. She has never used smokeless tobacco. She reports that she does not currently use alcohol. She reports that she does not use drugs. ? ?family history includes Coronary artery disease in an other family member; Diabetes in her father, maternal grandfather, mother, paternal aunt, paternal grandfather, paternal grandmother, paternal uncle, and another family member; Heart attack in her maternal grandfather, paternal aunt, paternal grandfather, paternal grandmother, and paternal uncle; Heart attack (age of onset: 85) in her father; Heart attack (age of onset: 61) in her sister; Heart disease in her father and mother; Hyperlipidemia in her brother, father, maternal grandfather, mother, paternal aunt, paternal grandfather, paternal grandmother, paternal uncle, sister, and another family member; Hypertension in her father, maternal  grandfather, mother, paternal aunt, paternal grandfather, paternal grandmother, paternal uncle, and another family member; Stroke in an other family member; Stroke (age of onset: 58) in her brother; Sudden death in her father, maternal grandfather, paternal aunt, paternal grandfather, paternal grandmother, and paternal uncle. ? ?No Known Allergies ? ?  ? ?PHYSICAL EXAMINATION: ? ?Vital signs: BP 134/69   Pulse 68   Temp (!) 97.5 ?F (36.4 ?C)   Ht '5\' 3"'$  (1.6 m)   Wt 136 lb (61.7 kg)   SpO2 100%   BMI 24.09 kg/m?  ?General: Well-developed, well-nourished, no acute distress ?HEENT: Sclerae are anicteric, conjunctiva pink. Oral mucosa intact ?Lungs: Clear ?Heart: Regular ?Abdomen: soft, nontender, nondistended, no obvious ascites, no peritoneal signs, normal bowel sounds. No organomegaly. ?Extremities: No edema ?Psychiatric: alert and oriented x3. Cooperative  ? ? ? ?ASSESSMENT: ? ?Sessile serrated polyp, personal history of ? ? ?PLAN: ? ? ?Surveillance colonoscopy ? ? ? ?  ?

## 2021-06-09 NOTE — Progress Notes (Signed)
VSS, transported to PACU °

## 2021-06-09 NOTE — Patient Instructions (Signed)
YOU HAD AN ENDOSCOPIC PROCEDURE TODAY AT THE [facility]:   Refer to the procedure report that was given to you for any specific questions about what was found during the examination.  If the procedure report does not answer your questions, please call your gastroenterologist to clarify.  If you requested that your care partner not be given the details of your procedure findings, then the procedure report has been included in a sealed envelope for you to review at your convenience later.  YOU SHOULD EXPECT: Some feelings of bloating in the abdomen. Passage of more gas than usual.  Walking can help get rid of the air that was put into your GI tract during the procedure and reduce the bloating. If you had a lower endoscopy (such as a colonoscopy or flexible sigmoidoscopy) you may notice spotting of blood in your stool or on the toilet paper. If you underwent a bowel prep for your procedure, you may not have a normal bowel movement for a few days.  Please Note:  You might notice some irritation and congestion in your nose or some drainage.  This is from the oxygen used during your procedure.  There is no need for concern and it should clear up in a day or so.  SYMPTOMS TO REPORT IMMEDIATELY:   Following lower endoscopy (colonoscopy or flexible sigmoidoscopy):  Excessive amounts of blood in the stool  Significant tenderness or worsening of abdominal pains  Swelling of the abdomen that is new, acute  Fever of 100F or higher  For urgent or emergent issues, a gastroenterologist can be reached at any hour by calling [phone]. Do not use MyChart messaging for urgent concerns.    DIET:  We do recommend a small meal at first, but then you may proceed to your regular diet.  Drink plenty of fluids but you should avoid alcoholic beverages for 24 hours.  ACTIVITY:  You should plan to take it easy for the rest of today and you should NOT DRIVE or use heavy machinery until tomorrow (because  of the sedation medicines used during the test).    FOLLOW UP: Our staff will call the number listed on your records 48-72 hours following your procedure to check on you and address any questions or concerns that you may have regarding the information given to you following your procedure. If we do not reach you, we will leave a message.  We will attempt to reach you two times.  During this call, we will ask if you have developed any symptoms of COVID 19. If you develop any symptoms (ie: fever, flu-like symptoms, shortness of breath, cough etc.) before then, please call [phone].  If you test positive for Covid 19 in the 2 weeks post procedure, please call and report this information to us.    If any biopsies were taken you will be contacted by phone or by letter within the next 1-3 weeks.  Please call us at [phone] if you have not heard about the biopsies in 3 weeks.    SIGNATURES/CONFIDENTIALITY: You and/or your care partner have signed paperwork which will be entered into your electronic medical record.  These signatures attest to the fact that that the information above on your After Visit Summary has been reviewed and is understood.  Full responsibility of the confidentiality of this discharge information lies with you and/or your care-partner. 

## 2021-06-09 NOTE — Op Note (Signed)
[facility] ?Patient Name: Bethany Wade ?Procedure Date: 06/09/2021 1:48 PM ?MRN: [id number] ?Endoscopist: Docia Chuck. Henrene Pastor , MD ?Age: 61 ?Referring MD:  ?Date of Birth: May 15, 1960 ?Gender: Female ?Account #: [id number] ?Procedure:                Colonoscopy ?Indications:              High risk colon cancer surveillance: Personal  ?                          history of sessile serrated colon polyp (less than  ?                          10 mm in size) with no dysplasia. Previous exam  ?                          April 2015 ?Medicines:                Monitored Anesthesia Care ?Procedure:                Pre-Anesthesia Assessment: ?                          - Prior to the procedure, a History and Physical  ?                          was performed, and patient medications and  ?                          allergies were reviewed. The patient's tolerance of  ?                          previous anesthesia was also reviewed. The risks  ?                          and benefits of the procedure and the sedation  ?                          options and risks were discussed with the patient.  ?                          All questions were answered, and informed consent  ?                          was obtained. Prior Anticoagulants: The patient has  ?                          taken no previous anticoagulant or antiplatelet  ?                          agents. ASA Grade Assessment: II - A patient with  ?                          mild systemic disease. After reviewing the risks  ?  and benefits, the patient was deemed in  ?                          satisfactory condition to undergo the procedure. ?                          After obtaining informed consent, the colonoscope  ?                          was passed under direct vision. Throughout the  ?                          procedure, the patient's blood pressure, pulse, and  ?                          oxygen saturations were monitored continuously. The  ?                           Colonoscope was introduced through the anus and  ?                          advanced to the the cecum, identified by  ?                          appendiceal orifice and ileocecal valve. The  ?                          ileocecal valve, appendiceal orifice, and rectum  ?                          were photographed. The quality of the bowel  ?                          preparation was excellent. The colonoscopy was  ?                          performed without difficulty. The patient tolerated  ?                          the procedure well. The bowel preparation used was  ?                          SUPREP via split dose instruction. ?Scope In: 2:00:12 PM ?Scope Out: 2:13:39 PM ?Scope Withdrawal Time: 0 hours 9 minutes 48 seconds  ?Total Procedure Duration: 0 hours 13 minutes 27 seconds  ?Findings:                 The entire examined colon appeared normal on direct  ?                          and retroflexion views. ?Complications:            No immediate complications. Estimated blood loss:  ?  None. ?Estimated Blood Loss:     Estimated blood loss: none. ?Impression:               - The entire examined colon is normal on direct and  ?                          retroflexion views. ?                          - No specimens collected. ?Recommendation:           - Repeat colonoscopy in 10 years for surveillance. ?                          - Patient has a contact number available for  ?                          emergencies. The signs and symptoms of potential  ?                          delayed complications were discussed with the  ?                          patient. Return to normal activities tomorrow.  ?                          Written discharge instructions were provided to the  ?                          patient. ?                          - Resume previous diet. ?                          - Continue present medications. ?Docia Chuck. Henrene Pastor, MD ?06/09/2021 2:17:04 PM ?This report  has been signed electronically. ?

## 2021-06-10 DIAGNOSIS — Z6823 Body mass index (BMI) 23.0-23.9, adult: Secondary | ICD-10-CM | POA: Diagnosis not present

## 2021-06-10 DIAGNOSIS — Z01419 Encounter for gynecological examination (general) (routine) without abnormal findings: Secondary | ICD-10-CM | POA: Diagnosis not present

## 2021-06-10 DIAGNOSIS — Z124 Encounter for screening for malignant neoplasm of cervix: Secondary | ICD-10-CM | POA: Diagnosis not present

## 2021-06-10 DIAGNOSIS — Z1231 Encounter for screening mammogram for malignant neoplasm of breast: Secondary | ICD-10-CM | POA: Diagnosis not present

## 2021-06-11 ENCOUNTER — Telehealth: Payer: Self-pay

## 2021-06-11 NOTE — Telephone Encounter (Signed)
  Follow up Call-     06/09/2021    1:04 PM  Call back number  Post procedure Call Back phone  # [phone]  Permission to leave phone message Yes     Patient questions:  Do you have a fever, pain , or abdominal swelling? No. Pain Score  0 *  Have you tolerated food without any problems? Yes.    Have you been able to return to your normal activities? Yes.    Do you have any questions about your discharge instructions: Diet   No. Medications  No. Follow up visit  No.  Do you have questions or concerns about your Care? No.  Actions: * If pain score is 4 or above: No action needed, pain <4.

## 2021-06-23 ENCOUNTER — Other Ambulatory Visit: Payer: Self-pay | Admitting: Family Medicine

## 2021-07-24 ENCOUNTER — Other Ambulatory Visit: Payer: Self-pay | Admitting: Family Medicine

## 2021-10-05 ENCOUNTER — Other Ambulatory Visit: Payer: Self-pay | Admitting: Family Medicine

## 2021-10-05 ENCOUNTER — Other Ambulatory Visit: Payer: Self-pay

## 2021-10-05 DIAGNOSIS — I10 Essential (primary) hypertension: Secondary | ICD-10-CM

## 2021-10-05 MED ORDER — LISINOPRIL 10 MG PO TABS: ORAL_TABLET | ORAL | 0 refills | Status: DC

## 2021-10-05 NOTE — Telephone Encounter (Signed)
Last OV-12-05-2020 Last refill-04-09-21-60 tabs, 5 refills  No future OV scheduled.

## 2021-12-30 ENCOUNTER — Other Ambulatory Visit: Payer: Self-pay | Admitting: Family Medicine

## 2021-12-30 DIAGNOSIS — I10 Essential (primary) hypertension: Secondary | ICD-10-CM

## 2022-01-10 ENCOUNTER — Other Ambulatory Visit: Payer: Self-pay | Admitting: Family Medicine

## 2022-02-01 ENCOUNTER — Other Ambulatory Visit: Payer: Self-pay | Admitting: Family Medicine

## 2022-03-04 ENCOUNTER — Other Ambulatory Visit: Payer: Self-pay | Admitting: Family Medicine

## 2022-03-04 NOTE — Telephone Encounter (Signed)
Left detailed message for pt to call the office to schedule a CPE/Medication refill, pt LOV was 11/2020

## 2022-03-08 ENCOUNTER — Encounter: Payer: Self-pay | Admitting: Family Medicine

## 2022-03-08 ENCOUNTER — Ambulatory Visit: Payer: BC Managed Care – PPO | Admitting: Family Medicine

## 2022-03-08 VITALS — BP 132/80 | HR 90 | Temp 98.1°F | Wt 141.0 lb

## 2022-03-08 DIAGNOSIS — S76011A Strain of muscle, fascia and tendon of right hip, initial encounter: Secondary | ICD-10-CM | POA: Diagnosis not present

## 2022-03-08 MED ORDER — SERTRALINE HCL 50 MG PO TABS: 50.0000 mg | ORAL_TABLET | Freq: Every day | ORAL | 3 refills | Status: DC

## 2022-03-08 NOTE — Progress Notes (Signed)
   Subjective:    Patient ID: Bethany Wade, female    DOB: [birth year], 62 y.o.   MRN: [id number]  HPI Here for one month of an intermittent sharp pain in the right groin area. No change in bowel habits or urinations. She has been attending a pilates class 4 days a week for several months.    Review of Systems  Constitutional: Negative.   Respiratory: Negative.    Cardiovascular: Negative.   Gastrointestinal: Negative.   Genitourinary: Negative.        Objective:   Physical Exam Constitutional:      General: She is not in acute distress.    Appearance: Normal appearance.  Cardiovascular:     Rate and Rhythm: Normal rate and regular rhythm.     Pulses: Normal pulses.     Heart sounds: Normal heart sounds.  Pulmonary:     Effort: Pulmonary effort is normal.     Breath sounds: Normal breath sounds.  Abdominal:     General: Abdomen is flat. Bowel sounds are normal. There is no distension.     Palpations: Abdomen is soft. There is no mass.     Tenderness: There is no abdominal tenderness. There is no guarding or rebound.     Hernia: No hernia is present.  Musculoskeletal:     Comments: She is tender over the proximal insertion of the right hip flexor. ROM is full. No swelling   Neurological:     Mental Status: She is alert.           Assessment & Plan:  Hip flexor strain. She needs to rest in order to give this time to heal. She will stop the Pilates for 4 weeks, and then she will ease back into it. I advised her not to attend classes more than 3 days a week  Alysia Penna, MD

## 2022-03-28 IMAGING — MR MR KNEE*L* W/O CM
5 of 7 series · 25 of 40 positions shown · non-contrast
Comparison: Plain films left knee 10/05/2019.

CLINICAL DATA: Left knee pain since an injury playing tennis
10/18/2019.

EXAM:
MRI OF THE LEFT KNEE WITHOUT CONTRAST
TECHNIQUE: Multiplanar, multisequence MR imaging of the knee was performed. No
intravenous contrast was administered.

[Series 3: T2 fat-sat · axial · 4.0mm · 0.50mm/px · z∈[-60,+55]mm · 5 of 24 slices shown (1 of 2)]
[im 1/24]
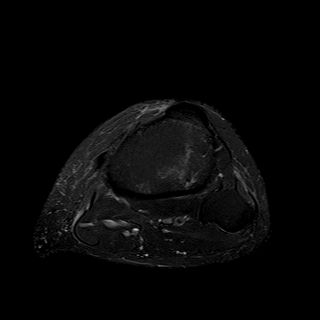
[im 6/24]
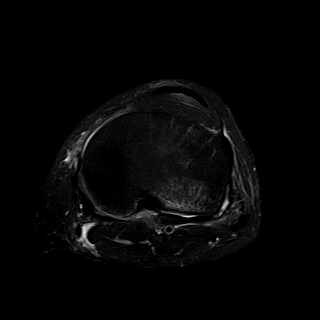
[im 12/24]
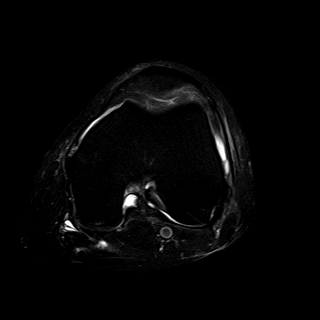
[im 18/24]
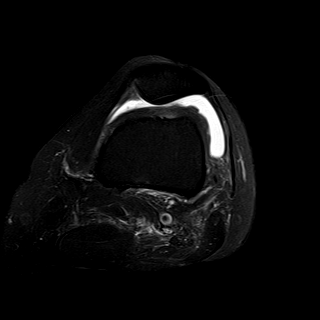
[im 24/24]
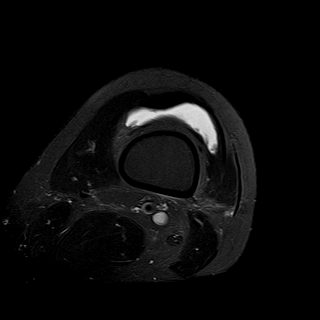

[Series 5: T2 fat-sat · coronal · 4.0mm · 0.29mm/px · 4 of 22 slices shown (2 of 2)]
[im 1/22]
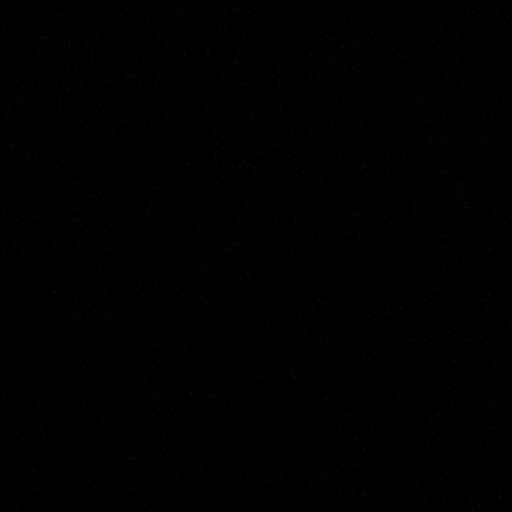
[im 5/22]
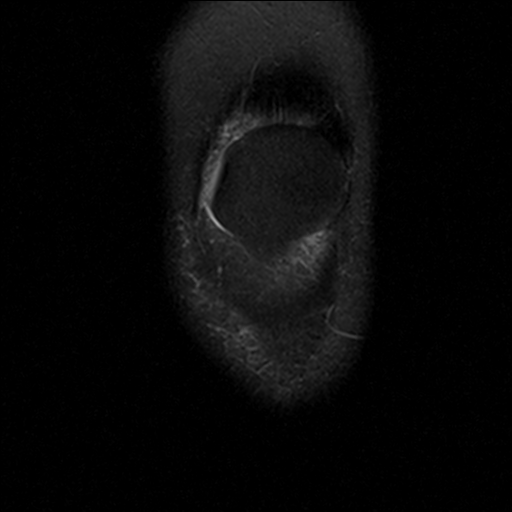
[im 9/22]
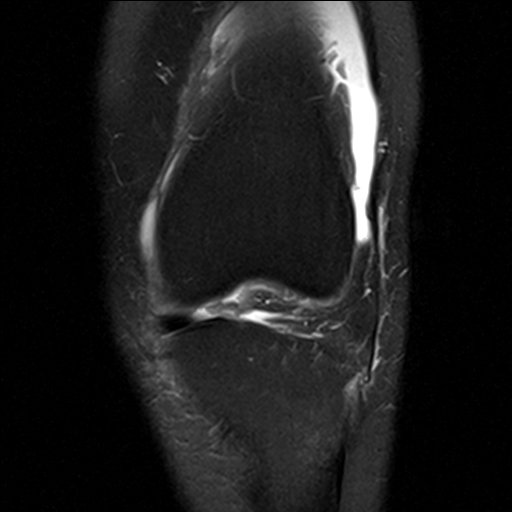
[im 13/22]
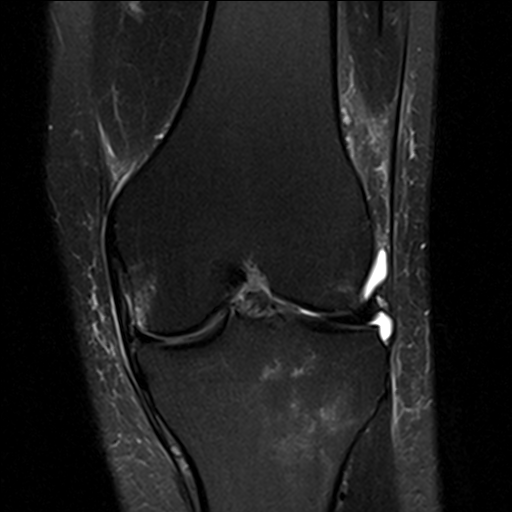

[Series 7: PD fat-sat · sagittal · 3.0mm · 0.29mm/px · 7 of 27 slices shown (1 of 3)]
[im 1/27]
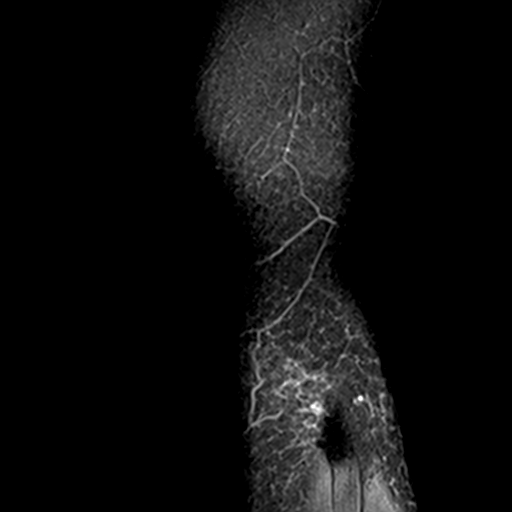
[im 5/27]
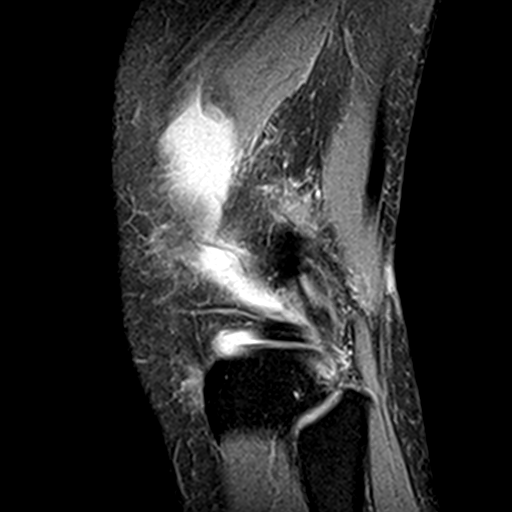
[im 9/27]
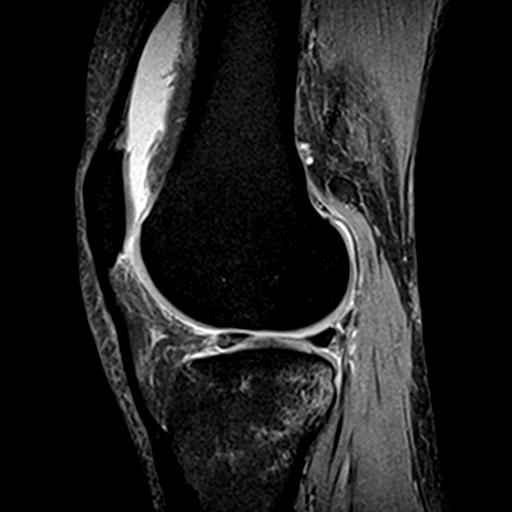
[im 14/27]
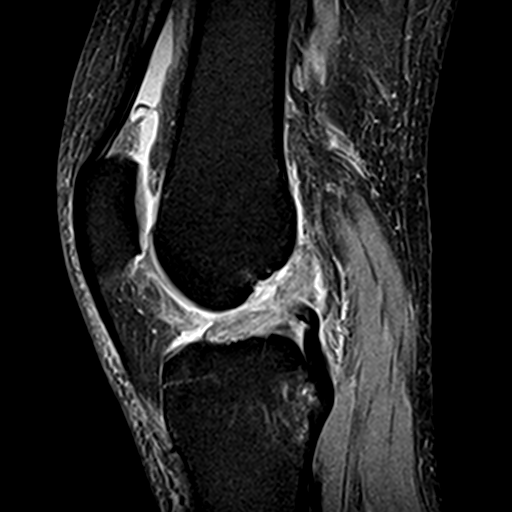
[im 18/27]
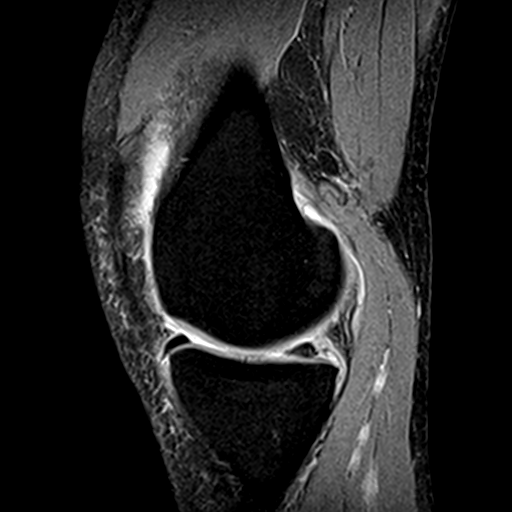
[im 22/27]
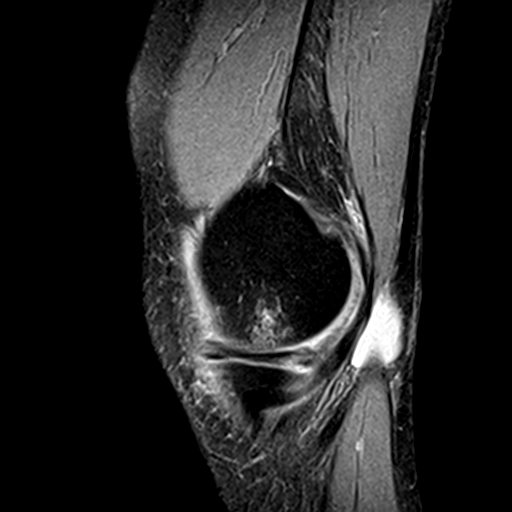
[im 27/27]
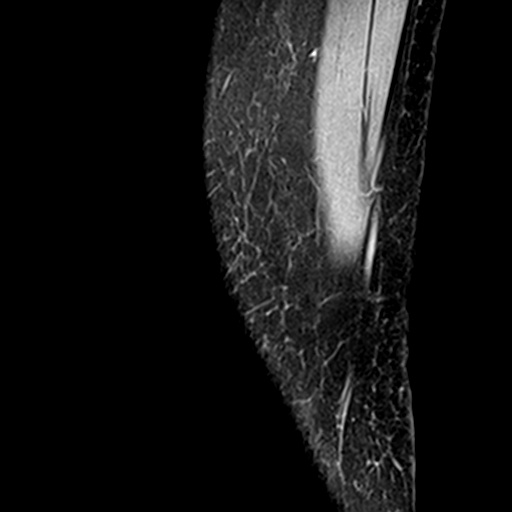

[Series 8: PD fat-sat · coronal · 4.0mm · 0.39mm/px · 6 of 22 slices shown (2 of 3)]
[im 1/22]
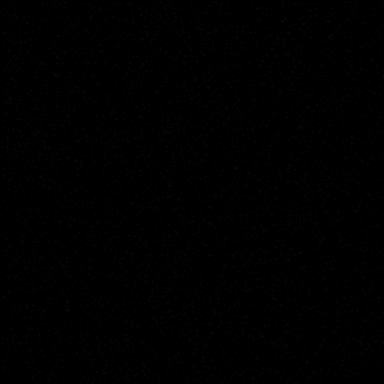
[im 5/22]
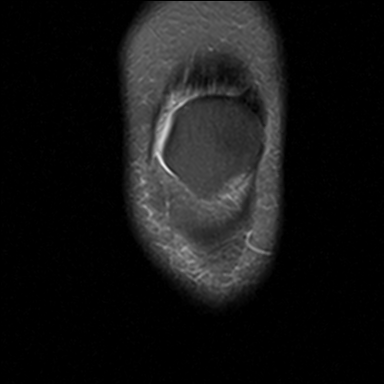
[im 9/22]
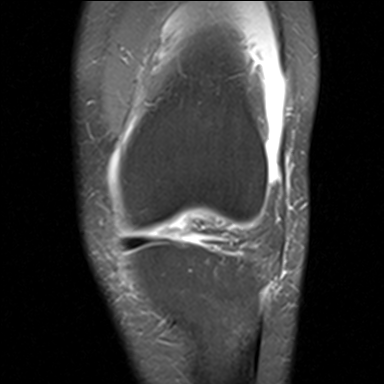
[im 13/22]
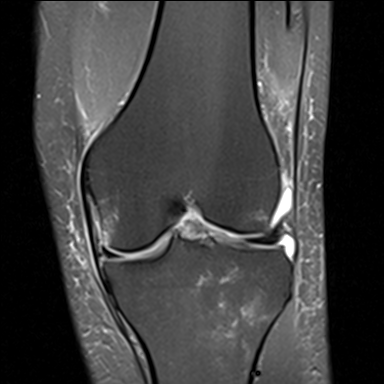
[im 17/22]
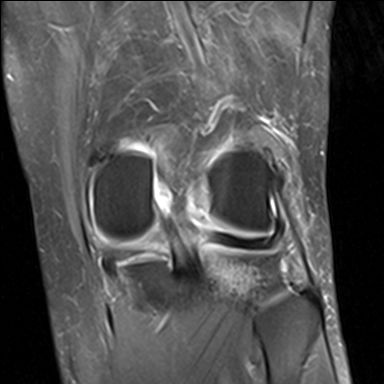
[im 22/22]
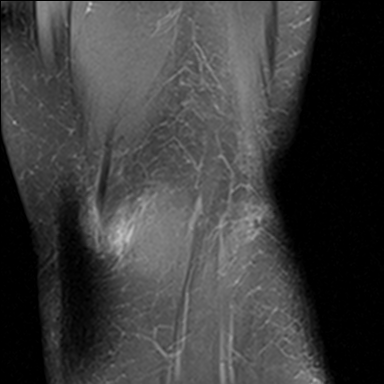

[Series 9: PD fat-sat · oblique · 2.0mm · 0.29mm/px · 3 of 11 slices shown (3 of 3)]
[im 1/11]
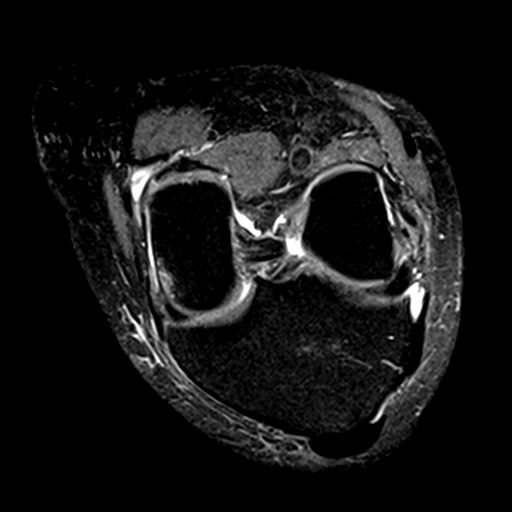
[im 6/11]
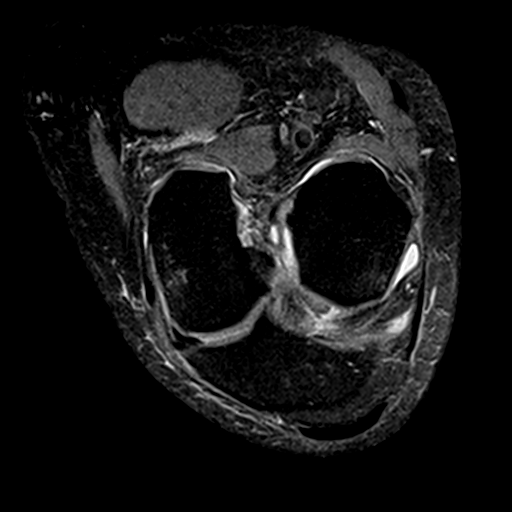
[im 11/11]
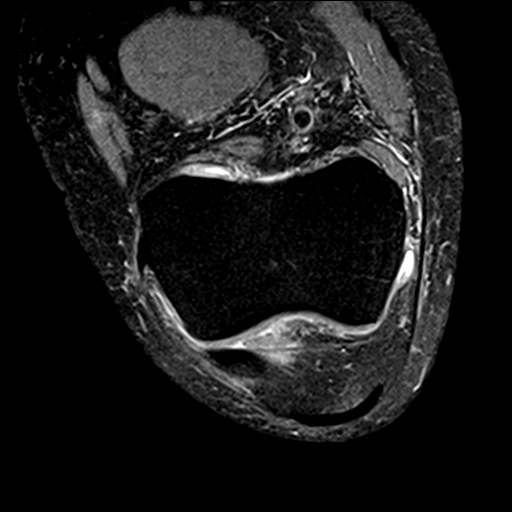

[25 of 40 positions shown; findings below may reference images not displayed]

FINDINGS: MENISCI

Medial meniscus: Horizontal tear in the posterior horn reaches the
meniscal undersurface. The tear assumes a longitudinal orientation
in the red zone of the posterior body.

Lateral meniscus:  Intact.

LIGAMENTS

Cruciates:  The ACL is completely torn.  The PCL is intact.

Collaterals:  Intact.

CARTILAGE

Patellofemoral:  Normal.

Medial:  Normal.

Lateral:  Normal.

Joint:  Moderate joint effusion.

Popliteal Fossa: Baker's cyst measures approximately 1.4 cm AP x
cm transverse x 3.3 cm craniocaudal.

Extensor Mechanism:  Intact.

Bones:  Bone contusions are seen about the knee.  No fracture.

Other: None.
IMPRESSION: Complete ACL tear with associated bone contusions and joint
effusion.

Horizontal tear posterior horn of the medial meniscus assumes a
longitudinal orientation in the posterior body. Tear in the body is
in the peripheral, red zone, of the meniscus.

## 2022-03-30 ENCOUNTER — Other Ambulatory Visit: Payer: Self-pay | Admitting: Family Medicine

## 2022-03-30 DIAGNOSIS — I10 Essential (primary) hypertension: Secondary | ICD-10-CM

## 2022-04-03 ENCOUNTER — Other Ambulatory Visit: Payer: Self-pay | Admitting: Family Medicine

## 2022-04-05 ENCOUNTER — Telehealth: Payer: Self-pay | Admitting: Family Medicine

## 2022-04-05 NOTE — Telephone Encounter (Signed)
Pt called to check up on her refill for the  temazepam temazepam (RESTORIL) 15 MG capsule   Pt states she is completely out and did not sleep at all last night.   Pt was informed that Rx was sent this morning 04/05/22 to  [facility] [place] [facility] [address] Phone: [phone]  Fax: [phone]

## 2022-04-05 NOTE — Telephone Encounter (Signed)
Spoke with pt stated that she picked up Rx from the pharmacy

## 2022-04-05 NOTE — Telephone Encounter (Signed)
Pt LOV was on 03/08/22 Last refill was done on 10/06/2021 Please advise

## 2022-04-06 ENCOUNTER — Ambulatory Visit (INDEPENDENT_AMBULATORY_CARE_PROVIDER_SITE_OTHER): Payer: BC Managed Care – PPO | Admitting: Family Medicine

## 2022-04-06 ENCOUNTER — Encounter: Payer: Self-pay | Admitting: Family Medicine

## 2022-04-06 VITALS — BP 116/78 | HR 66 | Temp 98.0°F | Ht 63.0 in | Wt 140.6 lb

## 2022-04-06 DIAGNOSIS — E538 Deficiency of other specified B group vitamins: Secondary | ICD-10-CM | POA: Diagnosis not present

## 2022-04-06 DIAGNOSIS — E785 Hyperlipidemia, unspecified: Secondary | ICD-10-CM | POA: Diagnosis not present

## 2022-04-06 DIAGNOSIS — Z0000 Encounter for general adult medical examination without abnormal findings: Secondary | ICD-10-CM | POA: Diagnosis not present

## 2022-04-06 LAB — LIPID PANEL
Cholesterol: 223 mg/dL — ABNORMAL HIGH (ref 0–200)
HDL: 65.4 mg/dL (ref >39.00)
LDL Cholesterol: 137 mg/dL — ABNORMAL HIGH (ref 0–99)
NonHDL: 157.88
Total CHOL/HDL Ratio: 3
Triglycerides: 106 mg/dL (ref 0.0–149.0)
VLDL: 21.2 mg/dL (ref 0.0–40.0)

## 2022-04-06 LAB — CBC WITH DIFFERENTIAL/PLATELET
Basophils Absolute: 0 10*3/uL (ref 0.0–0.1)
Basophils Relative: 0.5 % (ref 0.0–3.0)
Eosinophils Absolute: 0.2 10*3/uL (ref 0.0–0.7)
Eosinophils Relative: 3.3 % (ref 0.0–5.0)
HCT: 42.6 % (ref 36.0–46.0)
Hemoglobin: 14 g/dL (ref 12.0–15.0)
Lymphocytes Relative: 38.8 % (ref 12.0–46.0)
Lymphs Abs: 2 10*3/uL (ref 0.7–4.0)
MCHC: 33 g/dL (ref 30.0–36.0)
MCV: 87.9 fl (ref 78.0–100.0)
Monocytes Absolute: 0.2 10*3/uL (ref 0.1–1.0)
Monocytes Relative: 4.6 % (ref 3.0–12.0)
Neutro Abs: 2.7 10*3/uL (ref 1.4–7.7)
Neutrophils Relative %: 52.8 % (ref 43.0–77.0)
Platelets: 206 10*3/uL (ref 150.0–400.0)
RBC: 4.84 Mil/uL (ref 3.87–5.11)
RDW: 14.1 % (ref 11.5–15.5)
WBC: 5.1 10*3/uL (ref 4.0–10.5)

## 2022-04-06 LAB — BASIC METABOLIC PANEL
BUN: 16 mg/dL (ref 6–23)
CO2: 28 mEq/L (ref 19–32)
Calcium: 9.4 mg/dL (ref 8.4–10.5)
Chloride: 101 mEq/L (ref 96–112)
Creatinine, Ser: 0.83 mg/dL (ref 0.40–1.20)
GFR: 76.71 mL/min (ref >60.00)
Glucose, Bld: 97 mg/dL (ref 70–99)
Potassium: 4.7 mEq/L (ref 3.5–5.1)
Sodium: 138 mEq/L (ref 135–145)

## 2022-04-06 LAB — VITAMIN B12: Vitamin B-12: 374 pg/mL (ref 211–911)

## 2022-04-06 LAB — HEPATIC FUNCTION PANEL
ALT: 15 U/L (ref 0–35)
AST: 24 U/L (ref 0–37)
Albumin: 4.2 g/dL (ref 3.5–5.2)
Alkaline Phosphatase: 50 U/L (ref 39–117)
Bilirubin, Direct: 0.1 mg/dL (ref 0.0–0.3)
Total Bilirubin: 0.5 mg/dL (ref 0.2–1.2)
Total Protein: 7.3 g/dL (ref 6.0–8.3)

## 2022-04-06 LAB — TSH: TSH: 1.56 u[IU]/mL (ref 0.35–5.50)

## 2022-04-06 LAB — HEMOGLOBIN A1C: Hgb A1c MFr Bld: 5.5 % (ref 4.6–6.5)

## 2022-04-06 NOTE — Progress Notes (Signed)
   Subjective:    Patient ID: Bethany Wade, female    DOB: Jun 01, 1960, 62 y.o.   MRN: [id number]  HPI Here for a well exam. She feels great. We saw her recently for a hip flexor strain, and has totally healed.    Review of Systems  Constitutional: Negative.   HENT: Negative.    Eyes: Negative.   Respiratory: Negative.    Cardiovascular: Negative.   Gastrointestinal: Negative.   Genitourinary:  Negative for decreased urine volume, difficulty urinating, dyspareunia, dysuria, enuresis, flank pain, frequency, hematuria, pelvic pain and urgency.  Musculoskeletal: Negative.   Skin: Negative.   Neurological: Negative.  Negative for headaches.  Psychiatric/Behavioral: Negative.         Objective:   Physical Exam Constitutional:      General: She is not in acute distress.    Appearance: Normal appearance. She is well-developed.  HENT:     Head: Normocephalic and atraumatic.     Right Ear: External ear normal.     Left Ear: External ear normal.     Nose: Nose normal.     Mouth/Throat:     Pharynx: No oropharyngeal exudate.  Eyes:     General: No scleral icterus.    Conjunctiva/sclera: Conjunctivae normal.     Pupils: Pupils are equal, round, and reactive to light.  Neck:     Thyroid: No thyromegaly.     Vascular: No JVD.  Cardiovascular:     Rate and Rhythm: Normal rate and regular rhythm.     Heart sounds: Normal heart sounds. No murmur heard.    No friction rub. No gallop.  Pulmonary:     Effort: Pulmonary effort is normal. No respiratory distress.     Breath sounds: Normal breath sounds. No wheezing or rales.  Chest:     Chest wall: No tenderness.  Abdominal:     General: Bowel sounds are normal. There is no distension.     Palpations: Abdomen is soft. There is no mass.     Tenderness: There is no abdominal tenderness. There is no guarding or rebound.  Musculoskeletal:        General: No tenderness. Normal range of motion.     Cervical back: Normal range of  motion and neck supple.  Lymphadenopathy:     Cervical: No cervical adenopathy.  Skin:    General: Skin is warm and dry.     Findings: No erythema or rash.  Neurological:     Mental Status: She is alert and oriented to person, place, and time.     Cranial Nerves: No cranial nerve deficit.     Motor: No abnormal muscle tone.     Coordination: Coordination normal.     Deep Tendon Reflexes: Reflexes are normal and symmetric. Reflexes normal.  Psychiatric:        Behavior: Behavior normal.        Thought Content: Thought content normal.        Judgment: Judgment normal.           Assessment & Plan:  Well exam. We discussed diet and exercise. Get fasting labs. She is scheduled for a GYN exam next month.  Alysia Penna, MD

## 2022-04-07 MED ORDER — ATORVASTATIN CALCIUM 10 MG PO TABS: 10.0000 mg | ORAL_TABLET | Freq: Every day | ORAL | 3 refills | Status: DC

## 2022-04-07 NOTE — Addendum Note (Signed)
Addended by: Agnes Lawrence on: 04/07/2022 02:03 PM   Modules accepted: Orders

## 2022-04-27 DIAGNOSIS — Z13820 Encounter for screening for osteoporosis: Secondary | ICD-10-CM | POA: Diagnosis not present

## 2022-06-03 ENCOUNTER — Other Ambulatory Visit: Payer: Self-pay | Admitting: Family Medicine

## 2022-06-03 DIAGNOSIS — I10 Essential (primary) hypertension: Secondary | ICD-10-CM

## 2022-07-12 ENCOUNTER — Other Ambulatory Visit: Payer: BC Managed Care – PPO

## 2022-07-12 ENCOUNTER — Encounter: Payer: Self-pay | Admitting: Family Medicine

## 2022-07-12 DIAGNOSIS — E785 Hyperlipidemia, unspecified: Secondary | ICD-10-CM

## 2022-07-12 LAB — LIPID PANEL
Cholesterol: 162 mg/dL (ref 0–200)
HDL: 50.8 mg/dL (ref >39.00)
LDL Cholesterol: 88 mg/dL (ref 0–99)
NonHDL: 111.2
Total CHOL/HDL Ratio: 3
Triglycerides: 115 mg/dL (ref 0.0–149.0)
VLDL: 23 mg/dL (ref 0.0–40.0)

## 2022-08-12 DIAGNOSIS — Z1231 Encounter for screening mammogram for malignant neoplasm of breast: Secondary | ICD-10-CM | POA: Diagnosis not present

## 2022-08-12 DIAGNOSIS — Z01419 Encounter for gynecological examination (general) (routine) without abnormal findings: Secondary | ICD-10-CM | POA: Diagnosis not present

## 2022-08-12 DIAGNOSIS — Z6823 Body mass index (BMI) 23.0-23.9, adult: Secondary | ICD-10-CM | POA: Diagnosis not present

## 2022-09-28 ENCOUNTER — Other Ambulatory Visit: Payer: Self-pay | Admitting: Family Medicine

## 2022-09-28 DIAGNOSIS — I10 Essential (primary) hypertension: Secondary | ICD-10-CM

## 2022-09-28 NOTE — Telephone Encounter (Signed)
Pt LOV was on 04/06/22 Last refill was done on 04/05/22 Please advise

## 2022-10-29 ENCOUNTER — Other Ambulatory Visit: Payer: Self-pay | Admitting: Family Medicine

## 2022-10-29 DIAGNOSIS — I10 Essential (primary) hypertension: Secondary | ICD-10-CM

## 2022-12-14 ENCOUNTER — Other Ambulatory Visit: Payer: Self-pay

## 2022-12-14 ENCOUNTER — Emergency Department (HOSPITAL_BASED_OUTPATIENT_CLINIC_OR_DEPARTMENT_OTHER)
Admission: EM | Admit: 2022-12-14 | Discharge: 2022-12-14 | Disposition: A | Discharge status: Home / Self Care | Payer: BC Managed Care – PPO | Attending: Emergency Medicine | Admitting: Emergency Medicine

## 2022-12-14 ENCOUNTER — Encounter (HOSPITAL_BASED_OUTPATIENT_CLINIC_OR_DEPARTMENT_OTHER): Payer: Self-pay

## 2022-12-14 ENCOUNTER — Ambulatory Visit: Payer: BC Managed Care – PPO | Admitting: Family Medicine

## 2022-12-14 ENCOUNTER — Emergency Department (HOSPITAL_BASED_OUTPATIENT_CLINIC_OR_DEPARTMENT_OTHER): Payer: BC Managed Care – PPO

## 2022-12-14 DIAGNOSIS — R519 Headache, unspecified: Secondary | ICD-10-CM | POA: Diagnosis not present

## 2022-12-14 DIAGNOSIS — H539 Unspecified visual disturbance: Secondary | ICD-10-CM | POA: Diagnosis not present

## 2022-12-14 DIAGNOSIS — I6523 Occlusion and stenosis of bilateral carotid arteries: Secondary | ICD-10-CM | POA: Diagnosis not present

## 2022-12-14 DIAGNOSIS — R110 Nausea: Secondary | ICD-10-CM | POA: Diagnosis not present

## 2022-12-14 DIAGNOSIS — Z7982 Long term (current) use of aspirin: Secondary | ICD-10-CM | POA: Insufficient documentation

## 2022-12-14 DIAGNOSIS — R29818 Other symptoms and signs involving the nervous system: Secondary | ICD-10-CM | POA: Diagnosis not present

## 2022-12-14 LAB — BASIC METABOLIC PANEL
Anion gap: 8 (ref 5–15)
BUN: 31 mg/dL — ABNORMAL HIGH (ref 6–20)
CO2: 27 mmol/L (ref 22–32)
Calcium: 9.4 mg/dL (ref 8.9–10.3)
Chloride: 100 mmol/L (ref 98–111)
Creatinine, Ser: 0.75 mg/dL (ref 0.44–1.00)
GFR, Estimated: >60 mL/min (ref >60)
Glucose, Bld: 119 mg/dL — ABNORMAL HIGH (ref 70–99)
Potassium: 3.8 mmol/L (ref 3.5–5.1)
Sodium: 135 mmol/L (ref 135–145)

## 2022-12-14 LAB — CBC WITH DIFFERENTIAL/PLATELET
Abs Immature Granulocytes: 0.02 10*3/uL (ref 0.00–0.07)
Basophils Absolute: 0 10*3/uL (ref 0.0–0.1)
Basophils Relative: 0 %
Eosinophils Absolute: 0.1 10*3/uL (ref 0.0–0.5)
Eosinophils Relative: 2 %
HCT: 37.3 % (ref 36.0–46.0)
Hemoglobin: 12.4 g/dL (ref 12.0–15.0)
Immature Granulocytes: 0 %
Lymphocytes Relative: 17 %
Lymphs Abs: 1.2 10*3/uL (ref 0.7–4.0)
MCH: 28.8 pg (ref 26.0–34.0)
MCHC: 33.2 g/dL (ref 30.0–36.0)
MCV: 86.7 fL (ref 80.0–100.0)
Monocytes Absolute: 0.4 10*3/uL (ref 0.1–1.0)
Monocytes Relative: 5 %
Neutro Abs: 5.7 10*3/uL (ref 1.7–7.7)
Neutrophils Relative %: 76 %
Platelets: 180 10*3/uL (ref 150–400)
RBC: 4.3 MIL/uL (ref 3.87–5.11)
RDW: 13.4 % (ref 11.5–15.5)
WBC: 7.4 10*3/uL (ref 4.0–10.5)
nRBC: 0 % (ref 0.0–0.2)

## 2022-12-14 MED ORDER — IOHEXOL 350 MG/ML SOLN
100.0000 mL | Freq: Once | INTRAVENOUS | Status: AC | PRN
  Administered 2022-12-14: 75 mL via INTRAVENOUS

## 2022-12-14 NOTE — Discharge Instructions (Signed)
Please read and follow all provided instructions.  Your diagnoses today include:  1. Acute nonintractable headache, unspecified headache type   2. Visual disturbance     Tests performed today include: CT angio of your head and neck which did not show any serious cause of your headache Complete blood cell count: was normal Basic metabolic panel: was normal Vital signs. See below for your results today.   Medications:  None  Take any prescribed medications only as directed.  Additional information:  Follow any educational materials contained in this packet.  You are having a headache. No specific cause was found today for your headache. It may have been a migraine or other cause of headache. Stress, anxiety, fatigue, and depression are common triggers for headaches.   Your headache today does not appear to be life-threatening or require hospitalization, but often the exact cause of headaches is not determined in the emergency department. Therefore, follow-up with your doctor is very important to find out what may have caused your headache and whether or not you need any further diagnostic testing or treatment.   Sometimes headaches can appear benign (not harmful), but then more serious symptoms can develop which should prompt an immediate re-evaluation by your doctor or the emergency department.  BE VERY CAREFUL not to take multiple medicines containing Tylenol (also called acetaminophen). Doing so can lead to an overdose which can damage your liver and cause liver failure and possibly death.   Follow-up instructions: Please follow-up with your primary care provider in the next 2 days for further evaluation of your symptoms.   Return instructions:  Please return to the Emergency Department if you experience worsening symptoms. Return if the medications do not resolve your headache, if it recurs, or if you have multiple episodes of vomiting or cannot keep down fluids. Return if you  have a change from the usual headache. RETURN IMMEDIATELY IF you: Develop a sudden, severe headache Develop confusion or become poorly responsive or faint Develop a fever above 100.41F or problem breathing Have a change in speech, vision, swallowing, or understanding Develop new weakness, numbness, tingling, incoordination in your arms or legs Have a seizure Please return if you have any other emergent concerns.  Additional Information:  Your vital signs today were: BP (!) 142/80   Pulse 70   Temp 97.8 F (36.6 C)   Resp 18   Ht 5\' 4"  (1.626 m)   Wt 62.1 kg   SpO2 98%   BMI 23.52 kg/m  If your blood pressure (BP) was elevated above 135/85 this visit, please have this repeated by your doctor within one month. --------------

## 2022-12-14 NOTE — ED Triage Notes (Signed)
Patient became dizziness during palates. After lying down she experienced anxiety, visual changes, head pain, and nausea. Currently just feeling the headache. Took home medication of ASA 81 mg and takes HTN this am.

## 2022-12-14 NOTE — ED Provider Notes (Signed)
[place] EMERGENCY DEPARTMENT AT [facility] Provider Note   CSN: [id number] Arrival date & time: 12/14/22  1035     History  Chief Complaint  Patient presents with   Headache    Bethany Wade is a 62 y.o. female.  Patient with history of hypertension and high cholesterol, history of migraine -- presents to the emergency department today for evaluation of headache and vision changes.  This occurred at 9:30 AM.  Patient states that she awoke in her usual state of health this morning.  She went to the gym and walked on the treadmill at normal pace and then sat in a massage chair.  She then went to Pilates.  While preparing to do Pilates she had an acute onset of headache that was bitemporal with neck stiffness and associated bilateral vision changes described as flashing in both eyes and then dark areas.  The vision changes lasted for about 10 minutes.  Patient felt disoriented when this occurred.  She was having trouble thinking through simple tasks such as how to call her daughter.  When she did call her daughter, daughter states that she sounded disoriented.  Vision changes have resolved.  She reports residual mild headache.  Patient has neck tightness and stiffness at baseline for which she sees a chiropractor usually every week.  No vomiting associated with this.  She has had a remote history of migraines but states that this was different because she can usually tell when the headache is coming on with a migraine.  No treatments prior to arrival.       Home Medications Prior to Admission medications   Medication Sig Start Date End Date Taking? Authorizing Provider  acetaminophen (TYLENOL) 500 MG tablet Take 500 mg by mouth every 6 (six) hours as needed for moderate pain.    [provider]  aspirin 81 MG EC tablet Take 81 mg by mouth daily.    [provider]  aspirin-acetaminophen-caffeine (EXCEDRIN MIGRAINE) [phone] MG tablet Take 1 tablet by  mouth every 6 (six) hours as needed for headache.    [provider]  atorvastatin (LIPITOR) 10 MG tablet Take 1 tablet (10 mg total) by mouth daily. 04/07/22   Nelwyn Salisbury, MD  ibandronate (BONIVA) 150 MG tablet ibandronate 150 mg tablet  TAKE 1 TABLET BY MOUTH MONTHLY ON AN EMPTY STOMACH. DO NOT EAT OR LIE DOWN FOR 30 MINUTES    [provider]  lisinopril (ZESTRIL) 10 MG tablet TAKE 1 TABLET(10 MG) BY MOUTH DAILY 09/28/22   Nelwyn Salisbury, MD  magnesium gluconate (MAGONATE) 500 MG tablet Take 500 mg by mouth daily.    [provider]  Menthol-Camphor (TIGER BALM ARTHRITIS RUB EX) Apply 1 application topically as needed (muscle pain).    [provider]  Multiple Vitamins-Minerals (MULTIVITAMIN PO) Take by mouth daily.    [provider]  sertraline (ZOLOFT) 50 MG tablet Take 1 tablet (50 mg total) by mouth daily. 03/08/22   Nelwyn Salisbury, MD  temazepam (RESTORIL) 15 MG capsule TAKE 2 CAPSULES(30 MG) BY MOUTH AT BEDTIME 09/29/22   Nelwyn Salisbury, MD      Allergies    Patient has no known allergies.    Review of Systems   Review of Systems  Physical Exam Updated Vital Signs BP (!) 153/96 (BP Location: Right Arm)   Pulse 69   Temp 97.8 F (36.6 C)   Resp 18   Ht 5\' 4"  (1.626 m)  Wt 62.1 kg   SpO2 99%   BMI 23.52 kg/m  Physical Exam Vitals and nursing note reviewed.  Constitutional:      Appearance: She is well-developed.  HENT:     Head: Normocephalic and atraumatic.     Right Ear: External ear normal.     Left Ear: External ear normal.     Nose: Nose normal.     Mouth/Throat:     Pharynx: Uvula midline.  Eyes:     General: Lids are normal.     Extraocular Movements:     Right eye: No nystagmus.     Left eye: No nystagmus.     Conjunctiva/sclera: Conjunctivae normal.     Pupils: Pupils are equal, round, and reactive to light.  Neck:     Comments: No carotid bruits Cardiovascular:     Rate and Rhythm: Normal rate and  regular rhythm.  Pulmonary:     Effort: Pulmonary effort is normal.     Breath sounds: Normal breath sounds.  Abdominal:     Palpations: Abdomen is soft.     Tenderness: There is no abdominal tenderness.  Musculoskeletal:     Cervical back: Normal range of motion and neck supple. No tenderness or bony tenderness.  Skin:    General: Skin is warm and dry.  Neurological:     Mental Status: She is alert and oriented to person, place, and time.     GCS: GCS eye subscore is 4. GCS verbal subscore is 5. GCS motor subscore is 6.     Cranial Nerves: No cranial nerve deficit.     Sensory: No sensory deficit.     Motor: No weakness.     Coordination: Coordination normal.     Gait: Gait normal.     Comments: Upper extremity myotomes tested bilaterally:  C5 Shoulder abduction 5/5 C6 Elbow flexion/wrist extension 5/5 C7 Elbow extension 5/5 C8 Finger flexion 5/5 T1 Finger abduction 5/5  Lower extremity myotomes tested bilaterally: L2 Hip flexion 5/5 L3 Knee extension 5/5 L4 Ankle dorsiflexion 5/5 S1 Ankle plantar flexion 5/5      ED Results / Procedures / Treatments   Labs (all labs ordered are listed, but only abnormal results are displayed) Labs Reviewed  BASIC METABOLIC PANEL - Abnormal; Notable for the following components:      Result Value   Glucose, Bld 119 (*)    BUN 31 (*)    All other components within normal limits  CBC WITH DIFFERENTIAL/PLATELET    EKG None  Radiology CT ANGIO HEAD NECK W WO CM  Result Date: 12/14/2022 CLINICAL DATA:  Transient ischemic attack (TIA). Neuro deficit, acute, stroke suspected. Dizziness, visual vision changes, headache, and nausea. EXAM: CT ANGIOGRAPHY HEAD AND NECK WITH AND WITHOUT CONTRAST TECHNIQUE: Multidetector CT imaging of the head and neck was performed using the standard protocol during bolus administration of intravenous contrast. Multiplanar CT image reconstructions and MIPs were obtained to evaluate the vascular anatomy.  Carotid stenosis measurements (when applicable) are obtained utilizing NASCET criteria, using the distal internal carotid diameter as the denominator. RADIATION DOSE REDUCTION: This exam was performed according to the departmental dose-optimization program which includes automated exposure control, adjustment of the mA and/or kV according to patient size and/or use of iterative reconstruction technique. CONTRAST:  75mL OMNIPAQUE IOHEXOL 350 MG/ML SOLN COMPARISON:  CT head and cervical spine 07/02/2016 FINDINGS: CT HEAD FINDINGS Brain: There is no evidence of an acute infarct, intracranial hemorrhage, mass, midline shift, or extra-axial fluid collection.  The ventricles and sulci are normal. Vascular: No hyperdense vessel. Skull: No fracture or suspicious osseous lesion. Sinuses/Orbits: Mild mucosal thickening in the left maxillary sinus. Clear mastoid air cells. Unremarkable orbits. Other: None. Review of the MIP images confirms the above findings CTA NECK FINDINGS Aortic arch: Standard 3 vessel aortic arch with widely patent arch vessel origins. Right carotid system: Patent without evidence of stenosis, dissection, or significant atherosclerosis. Left carotid system: Patent without evidence of stenosis, dissection, or significant atherosclerosis. Vertebral arteries: Patent with the left being dominant. No evidence of a significant stenosis or dissection allowing for venous contrast which mildly limits assessment of the V1 segments. Skeleton: Mild cervical spondylosis. Other neck: No evidence of cervical lymphadenopathy or mass. Upper chest: Clear lung apices. Review of the MIP images confirms the above findings CTA HEAD FINDINGS Anterior circulation: The internal carotid arteries are patent from skull base to carotid termini with minimal calcified plaque bilaterally not resulting in significant stenosis. ACAs and MCAs are patent ACAs and MCAs are patent without evidence of a proximal branch occlusion or significant  proximal stenosis. No aneurysm is identified. Posterior circulation: The intracranial vertebral arteries are widely patent to the basilar with the left being moderately dominant. The basilar artery is widely patent. There patent posterior communicating arteries bilaterally. Both PCAs are patent without evidence of a significant proximal stenosis. No aneurysm is identified. Venous sinuses: As permitted by contrast timing, patent. Anatomic variants: None. Review of the MIP images confirms the above findings IMPRESSION: Negative CTA of the head and neck. Electronically Signed   By: Sebastian Ache M.D.   On: 12/14/2022 14:55    Procedures Procedures    Medications Ordered in ED Medications - No data to display  ED Course/ Medical Decision Making/ A&P    Patient seen and examined. History obtained directly from patient.   Labs/EKG: Ordered CBC, BMP.  Imaging: Ordered CTA head and neck.  Medications/Fluids: None ordered.  Considered migraine cocktail however symptoms have mostly resolved.  Most recent vital signs reviewed and are as follows: BP (!) 153/96 (BP Location: Right Arm)   Pulse 69   Temp 97.8 F (36.6 C)   Resp 18   Ht 5\' 4"  (1.626 m)   Wt 62.1 kg   SpO2 99%   BMI 23.52 kg/m   Initial impression: Atypical headache with vision changes.  3:14 PM Reassessment performed. Patient appears stable.  Reports headache 2 out of 10 at the current time.  No recurrent vision changes.  Labs personally reviewed and interpreted including: CBC unremarkable; BMP unremarkable.  Imaging personally visualized and interpreted including: CT of the head and neck, agree no signs of bleeding.  Reviewed pertinent lab work and imaging with patient at bedside. Questions answered.   Most current vital signs reviewed and are as follows: BP (!) 142/80   Pulse 70   Temp 97.8 F (36.6 C)   Resp 18   Ht 5\' 4"  (1.626 m)   Wt 62.1 kg   SpO2 98%   BMI 23.52 kg/m   Plan: Will discuss findings with  neurology.  3:29 PM I discussed patient presentation, history, and CTA findings with Dr. Selina Cooley of neurology.  Given that symptoms are essentially resolved and consistent with migraine with aura, she does not feel that she requires further evaluation in the hospital currently, but can follow-up as outpatient.  I agree with these recommendations.  Patient and daughter at bedside are comfortable with these recommendations as well.    Reviewed pertinent lab  work and imaging with patient at bedside. Questions answered.   Most current vital signs reviewed and are as follows: BP (!) 142/80   Pulse 70   Temp 97.8 F (36.6 C)   Resp 18   Ht 5\' 4"  (1.626 m)   Wt 62.1 kg   SpO2 98%   BMI 23.52 kg/m   Plan: Discharge to home.   Prescriptions written for: None  Other home care instructions discussed: Rest, hydration, avoidance of triggers  ED return instructions discussed: Patient counseled to return if they have any recurrent symptoms, or any strokelike symptoms including weakness in their arms or legs, slurred speech, trouble walking or talking, confusion, trouble with their balance, or if they have any other concerns. Patient verbalizes understanding and agrees with plan.   Follow-up instructions discussed: Patient encouraged to follow-up with their PCP in 2 days.                                 Medical Decision Making Amount and/or Complexity of Data Reviewed Labs: ordered. Radiology: ordered.  Risk Prescription drug management.   In regards to the patient's headache, critical differentials were considered including subarachnoid hemorrhage, intracerebral hemorrhage, epidural/subdural hematoma, pituitary apoplexy, vertebral/carotid artery dissection, giant cell arteritis, central venous thrombosis, reversible cerebral vasoconstriction, acute angle closure glaucoma, idiopathic intracranial hypertension, bacterial meningitis, viral encephalitis, carbon monoxide poisoning, posterior  reversible encephalopathy syndrome.   Reg flag symptoms related to these causes were considered including systemic symptoms (fever, weight loss), neurologic symptoms (confusion, mental status change, vision change, associated seizure), acute or sudden "thunderclap" onset, patient age 34 or older with new or progressive headache, patient of any age with first headache or change in headache pattern, pregnant or postpartum status, history of HIV or other immunocompromise, history of cancer, headache occurring with exertion, associated neck or shoulder pain, associated traumatic injury, concurrent use of anticoagulation, family history of spontaneous SAH, and concurrent drug use.    Other benign, more common causes of headache were considered including migraine, tension-type headache, cluster headache, referred pain from other cause such as sinus infection, dental pain, trigeminal neuralgia.   On exam, patient has a reassuring neuro exam including baseline mental status, no significant neck pain or meningeal signs, no signs of severe infection or fever.   CTA of the head and neck was largely unremarkable.  Discussed with on-call neurology.  Patient seems appropriate for outpatient workup and symptoms seem most likely migrainous in nature.  Patient has good PCP follow-up and is reliable to return with worsening.  The patient's vital signs, pertinent lab work and imaging were reviewed and interpreted as discussed in the ED course. Hospitalization was considered for further testing, treatments, or serial exams/observation. However as patient is well-appearing, has a stable exam over the course of their evaluation, and reassuring studies today, I do not feel that they warrant admission at this time. This plan was discussed with the patient who verbalizes agreement and comfort with this plan and seems reliable and able to return to the Emergency Department with worsening or changing symptoms.           Final Clinical Impression(s) / ED Diagnoses Final diagnoses:  Acute nonintractable headache, unspecified headache type  Visual disturbance    Rx / DC Orders ED Discharge Orders     None         Renne Crigler, PA-C 12/14/22 1532    Terald Sleeper, MD 12/15/22  0806  

## 2022-12-14 NOTE — ED Notes (Signed)
Pt to CT via w/c

## 2022-12-15 ENCOUNTER — Telehealth: Payer: Self-pay

## 2022-12-15 NOTE — Transitions of Care (Post Inpatient/ED Visit) (Signed)
   12/15/2022  Name: Bethany Wade [facility] MRN: [id number] DOB: Oct 12, 1960  Today's TOC FU Call Status: Today's TOC FU Call Status:: Successful TOC FU Call Completed TOC FU Call Complete Date: 12/15/22 Patient's Name and Date of Birth confirmed.  Transition Care Management Follow-up Telephone Call Date of Discharge: 12/14/22 Discharge Facility: [facility] ([facility]) Type of Discharge: Emergency Department Reason for ED Visit: Other: (headache) How have you been since you were released from the hospital?: Better Any questions or concerns?: No  Items Reviewed: Did you receive and understand the discharge instructions provided?: Yes Medications obtained,verified, and reconciled?: Yes (Medications Reviewed) Any new allergies since your discharge?: No Dietary orders reviewed?: Yes Do you have support at home?: Yes People in Home: spouse  Medications Reviewed Today: Medications Reviewed Today     Reviewed by Karena Addison, LPN (Licensed Practical Nurse) on 12/15/22 at [phone]  Med List Status: <None>   Medication Order Taking? Sig Documenting Provider Last Dose Status Informant  acetaminophen (TYLENOL) 500 MG tablet [id number] No Take 500 mg by mouth every 6 (six) hours as needed for moderate pain. [provider] Taking Active Self  aspirin 81 MG EC tablet [id number] No Take 81 mg by mouth daily. [provider] Taking Active Self  aspirin-acetaminophen-caffeine (EXCEDRIN MIGRAINE) [phone] MG tablet [id number] No Take 1 tablet by mouth every 6 (six) hours as needed for headache. [provider] Taking Active Self  atorvastatin (LIPITOR) 10 MG tablet [id number]  Take 1 tablet (10 mg total) by mouth daily. Nelwyn Salisbury, MD  Active   ibandronate (BONIVA) 150 MG tablet [id number] No ibandronate 150 mg tablet  TAKE 1 TABLET BY MOUTH MONTHLY ON AN EMPTY STOMACH. DO NOT EAT OR LIE DOWN FOR 30 MINUTES [provider] Taking Active   lisinopril (ZESTRIL)  10 MG tablet [id number]  TAKE 1 TABLET(10 MG) BY MOUTH DAILY Nelwyn Salisbury, MD  Active   magnesium gluconate (MAGONATE) 500 MG tablet [id number] No Take 500 mg by mouth daily. [provider] Taking Active Self  Menthol-Camphor (TIGER BALM ARTHRITIS RUB EX) [id number] No Apply 1 application topically as needed (muscle pain). [provider] Taking Active Self  Multiple Vitamins-Minerals (MULTIVITAMIN PO) [id number] No Take by mouth daily. [provider] Taking Active Self  sertraline (ZOLOFT) 50 MG tablet [id number] No Take 1 tablet (50 mg total) by mouth daily. Nelwyn Salisbury, MD Taking Active   temazepam (RESTORIL) 15 MG capsule [id number]  TAKE 2 CAPSULES(30 MG) BY MOUTH AT BEDTIME Nelwyn Salisbury, MD  Active             Home Care and Equipment/Supplies: Were Home Health Services Ordered?: NA Any new equipment or medical supplies ordered?: NA  Functional Questionnaire: Do you need assistance with bathing/showering or dressing?: No Do you need assistance with meal preparation?: No Do you need assistance with eating?: No Do you have difficulty maintaining continence: No Do you need assistance with getting out of bed/getting out of a chair/moving?: No Do you have difficulty managing or taking your medications?: No  Follow up appointments reviewed: PCP Follow-up appointment confirmed?: Yes Date of PCP follow-up appointment?: 12/17/22 Follow-up Provider: [facility] Follow-up appointment confirmed?: NA Do you need transportation to your follow-up appointment?: No Do you understand care options if your condition(s) worsen?: Yes-patient verbalized understanding    SIGNATURE Karena Addison, LPN [facility] Nurse Health Advisor Direct Dial [phone]

## 2022-12-17 ENCOUNTER — Encounter: Payer: Self-pay | Admitting: Family Medicine

## 2022-12-17 ENCOUNTER — Ambulatory Visit: Payer: BC Managed Care – PPO | Admitting: Family Medicine

## 2022-12-17 VITALS — BP 136/82 | HR 68 | Temp 98.1°F | Wt 135.0 lb

## 2022-12-17 DIAGNOSIS — E785 Hyperlipidemia, unspecified: Secondary | ICD-10-CM

## 2022-12-17 DIAGNOSIS — G43109 Migraine with aura, not intractable, without status migrainosus: Secondary | ICD-10-CM

## 2022-12-17 DIAGNOSIS — I10 Essential (primary) hypertension: Secondary | ICD-10-CM

## 2022-12-17 LAB — LIPID PANEL
Cholesterol: 171 mg/dL (ref 0–200)
HDL: 61.6 mg/dL (ref >39.00)
LDL Cholesterol: 97 mg/dL (ref 0–99)
NonHDL: 109.8
Total CHOL/HDL Ratio: 3
Triglycerides: 65 mg/dL (ref 0.0–149.0)
VLDL: 13 mg/dL (ref 0.0–40.0)

## 2022-12-17 LAB — TSH: TSH: 2.58 u[IU]/mL (ref 0.35–5.50)

## 2022-12-17 LAB — HEPATIC FUNCTION PANEL
ALT: 21 U/L (ref 0–35)
AST: 26 U/L (ref 0–37)
Albumin: 4.7 g/dL (ref 3.5–5.2)
Alkaline Phosphatase: 56 U/L (ref 39–117)
Bilirubin, Direct: 0.1 mg/dL (ref 0.0–0.3)
Total Bilirubin: 0.6 mg/dL (ref 0.2–1.2)
Total Protein: 7.7 g/dL (ref 6.0–8.3)

## 2022-12-17 MED ORDER — SUMATRIPTAN SUCCINATE 100 MG PO TABS: 100.0000 mg | ORAL_TABLET | ORAL | 11 refills | Status: AC | PRN

## 2022-12-17 NOTE — Progress Notes (Signed)
   Subjective:    Patient ID: Bethany Wade, female    DOB: [birth year], 61 y.o.   MRN: [id number]  HPI Here for a transitional care visit to follow up on an ED visit on 12-14-22 for a severe headache. She has a hx of migraines, but they have not bothered her for awhile. That morning while she was working out at [employer], she had the sudden onset of a severe bitemporal headache along with blurred vision and seeing flashing lights. No nausea or vomiting. She phoned her daughter, who drove her to the ED. On arrival her BP was 153/96 but this was down to 142/80 by DC. Her exam was normal. Her labs were normal. Her CTA of the head and neck were normal. She was felt to have had a migraine headache, and the neurologist (who did a virtual consult) agreed. She was sent home with no medication. She has felt fine since then. She denies any recent medication changes. She takes some OTC supplements, which have been the same for at least a year.   Review of Systems  Constitutional: Negative.   HENT: Negative.    Eyes:  Positive for visual disturbance.  Respiratory: Negative.    Cardiovascular: Negative.   Neurological:  Positive for headaches. Negative for dizziness, tremors, seizures, syncope, facial asymmetry, speech difficulty, weakness, light-headedness and numbness.       Objective:   Physical Exam Constitutional:      General: She is not in acute distress.    Appearance: Normal appearance.  Eyes:     Pupils: Pupils are equal, round, and reactive to light.     Comments: No photophobia   Cardiovascular:     Rate and Rhythm: Normal rate and regular rhythm.     Pulses: Normal pulses.     Heart sounds: Normal heart sounds.  Pulmonary:     Effort: Pulmonary effort is normal.     Breath sounds: Normal breath sounds.  Neurological:     General: No focal deficit present.     Mental Status: She is alert and oriented to person, place, and time.           Assessment & Plan:  Migraine  headache. We discussed not drinking more than one cup of coffee a day. She will continue her normal exercise routine. We wrote for Sumatriptan 100 mg to keep on hand in case the headache returns. We spent a total of (35   ) minutes reviewing records and discussing these issues.  Gershon Crane, MD

## 2022-12-21 NOTE — Telephone Encounter (Signed)
These are nothing to worry about. The BUN can go up little when you are dehydrated. The glucose was a random value (she was not fasting for 8 hours) so 119 is quite normal.

## 2022-12-22 ENCOUNTER — Encounter: Payer: Self-pay | Admitting: Family Medicine

## 2022-12-24 ENCOUNTER — Other Ambulatory Visit: Payer: Self-pay | Admitting: Family Medicine

## 2022-12-24 DIAGNOSIS — I10 Essential (primary) hypertension: Secondary | ICD-10-CM

## 2022-12-28 NOTE — Telephone Encounter (Signed)
There is nothing to worry about here. The BUN was slightly increased likely due to mild dehydration. All her other markers for kidney function were normal. The glucose was normal for someone who had not been fasting for 8 hours.

## 2023-01-17 ENCOUNTER — Ambulatory Visit: Payer: Self-pay | Admitting: Family Medicine

## 2023-01-17 ENCOUNTER — Ambulatory Visit
Admission: EM | Admit: 2023-01-17 | Discharge: 2023-01-17 | Disposition: A | Discharge status: Home / Self Care | Payer: BC Managed Care – PPO | Attending: Internal Medicine | Admitting: Internal Medicine

## 2023-01-17 DIAGNOSIS — T781XXA Other adverse food reactions, not elsewhere classified, initial encounter: Secondary | ICD-10-CM | POA: Diagnosis not present

## 2023-01-17 MED ORDER — TRIAMCINOLONE ACETONIDE 0.1 % EX CREA: 1.0000 | TOPICAL_CREAM | Freq: Two times a day (BID) | CUTANEOUS | 0 refills | Status: DC

## 2023-01-17 MED ORDER — PREDNISONE 10 MG (21) PO TBPK: ORAL_TABLET | Freq: Every day | ORAL | 0 refills | Status: DC

## 2023-01-17 NOTE — ED Triage Notes (Addendum)
Pt presents to UC for c/o itchy rash on neck, back, and arms x2 days.  She used neosporin which helped some. Pt states she recently ate a lobster spread that she's never eaten before.

## 2023-01-17 NOTE — Discharge Instructions (Addendum)
Rash is most consistent with an allergic reaction. Given the extent of the rash we will treat with a steroid to reduce inflammation:   Prednisone taper Take 6 tabs by mouth daily for 2 days, then 5 tabs for 2 days, then 4 tabs for 2 days, then 3 tabs for 2 days, 2 tabs for 2 days, then 1 tab by mouth daily for 2 days Triamcinolone cream twice daily to the affect areas. DO not apply to the face.  Return to urgent care or PCP if symptoms worsen or fail to resolve.

## 2023-01-17 NOTE — Telephone Encounter (Signed)
FYI  Patient was seen at the ED today

## 2023-01-17 NOTE — ED Provider Notes (Signed)
[facility]    CSN: [id number] Arrival date & time: 01/17/23  1359      History   Chief Complaint Chief Complaint  Patient presents with   Rash    HPI Bethany Wade is a 62 y.o. female.   62 yr old female who presents to urgent care with complaints of a diffuse body wide rash that started about 2 days ago.  She reports it initially started around her upper back and then she started to notice it on the lower back and legs.  She is also felt that she might have a little itching on the face.  The only thing that she can think of that was different recently was that she did eat a lobster platter from [employer] and she has never had this before.  She denies any difficulty breathing swelling in the face or throat.  She denies fevers or chills.  She has had no sick contacts.  She has had no recent changes in medications, soaps or any other items that she can think of.   Rash Associated symptoms: no abdominal pain, no fever, no joint pain, no shortness of breath, no sore throat and not vomiting     Past Medical History:  Diagnosis Date   Anxiety    GERD (gastroesophageal reflux disease)    Hypertension    Insomnia    Osteopenia     Patient Active Problem List   Diagnosis Date Noted   HTN (hypertension) 02/13/2014   GERD (gastroesophageal reflux disease) 03/07/2013   Dyslipidemia 07/25/2012   Left shoulder pain 12/14/2010   Left leg pain 12/01/2010   Migraine headache 06/06/2009   TALIPES CAVUS 04/28/2009   GANGLION CYST, WRIST, RIGHT 03/25/2009   LIVER FUNCTION TESTS, ABNORMAL, HX OF 10/08/2008   INSOMNIA 10/16/2007   Anxiety state 08/16/2006   POSITIVE PPD 08/16/2006   History of colonic polyps 08/16/2006    Past Surgical History:  Procedure Laterality Date   ANTERIOR CRUCIATE LIGAMENT REPAIR Left 11/26/2019   Procedure: LEFT KNEE RECONSTRUCTION ANTERIOR CRUCIATE LIGAMENT (ACL) WITH HAMSTRING GRAFT, MEDIAL MENISCAL REPAIR;  Surgeon: Cammy Copa,  MD;  Location: [facility];  Service: Orthopedics;  Laterality: Left;   APPENDECTOMY     CESAREAN SECTION     x2   COLONOSCOPY  06/09/2021   per Dr. Marina Goodell, no polyps, repeat in 10 yrs   MENISCUS REPAIR Left 11/26/2019   Procedure: REPAIR OF MENISCUS;  Surgeon: Cammy Copa, MD;  Location: [facility];  Service: Orthopedics;  Laterality: Left;    OB History   No obstetric history on file.      Home Medications    Prior to Admission medications   Medication Sig Start Date End Date Taking? Authorizing Provider  predniSONE (STERAPRED UNI-PAK 21 TAB) 10 MG (21) TBPK tablet Take by mouth daily. Take 6 tabs by mouth daily for 2 days, then 5 tabs for 2 days, then 4 tabs for 2 days, then 3 tabs for 2 days, 2 tabs for 2 days, then 1 tab by mouth daily for 2 days 01/17/23  Yes Aadhya Bustamante A, PA-C  triamcinolone cream (KENALOG) 0.1 % Apply 1 Application topically 2 (two) times daily. Twice daily as needed for itching. Do not apply to the face 01/17/23  Yes Landis Martins, PA-C  aspirin 81 MG EC tablet Take 81 mg by mouth daily.    [provider]  atorvastatin (LIPITOR) 10 MG tablet Take 1 tablet (10  mg total) by mouth daily. 04/07/22   Nelwyn Salisbury, MD  ibandronate (BONIVA) 150 MG tablet ibandronate 150 mg tablet  TAKE 1 TABLET BY MOUTH MONTHLY ON AN EMPTY STOMACH. DO NOT EAT OR LIE DOWN FOR 30 MINUTES    [provider]  lisinopril (ZESTRIL) 10 MG tablet TAKE 1 TABLET(10 MG) BY MOUTH DAILY 12/27/22   Nelwyn Salisbury, MD  magnesium gluconate (MAGONATE) 500 MG tablet Take 500 mg by mouth daily.    [provider]  Multiple Vitamins-Minerals (MULTIVITAMIN PO) Take by mouth daily.    [provider]  sertraline (ZOLOFT) 50 MG tablet Take 1 tablet (50 mg total) by mouth daily. 03/08/22   Nelwyn Salisbury, MD  SUMAtriptan (IMITREX) 100 MG tablet Take 1 tablet (100 mg total) by mouth as needed for migraine. May repeat in 2 hours  if headache persists or recurs. 12/17/22   Nelwyn Salisbury, MD  temazepam (RESTORIL) 15 MG capsule TAKE 2 CAPSULES(30 MG) BY MOUTH AT BEDTIME 09/29/22   Nelwyn Salisbury, MD    Family History Family History  Problem Relation Age of Onset   Heart attack Father 3       mi; several brothers with SCD   Diabetes Father    Hyperlipidemia Father    Sudden death Father    Heart disease Father    Hypertension Father    Heart attack Sister 94   Hyperlipidemia Sister    Coronary artery disease Other        fm hx   Diabetes Other        fm hx   Hyperlipidemia Other        fm hx   Hypertension Other        fm hx   Stroke Other        <60 fm hx   Diabetes Mother    Hyperlipidemia Mother    Heart disease Mother    Hypertension Mother    Hyperlipidemia Brother    Stroke Brother 90   Diabetes Paternal Aunt    Heart attack Paternal Aunt    Hyperlipidemia Paternal Aunt    Hypertension Paternal Aunt    Sudden death Paternal Aunt    Diabetes Paternal Uncle    Heart attack Paternal Uncle    Hyperlipidemia Paternal Uncle    Hypertension Paternal Uncle    Sudden death Paternal Uncle    Diabetes Maternal Grandfather    Sudden death Maternal Grandfather    Hypertension Maternal Grandfather    Hyperlipidemia Maternal Grandfather    Heart attack Maternal Grandfather    Diabetes Paternal Grandmother    Sudden death Paternal Grandmother    Hyperlipidemia Paternal Grandmother    Heart attack Paternal Grandmother    Hypertension Paternal Grandmother    Diabetes Paternal Grandfather    Sudden death Paternal Grandfather    Heart attack Paternal Grandfather    Hyperlipidemia Paternal Grandfather    Hypertension Paternal Grandfather    Colon cancer Neg Hx    Esophageal cancer Neg Hx    Rectal cancer Neg Hx    Stomach cancer Neg Hx     Social History Social History   Tobacco Use   Smoking status: Never   Smokeless tobacco: Never  Vaping Use   Vaping status: Never Used  Substance Use  Topics   Alcohol use: Not Currently   Drug use: No     Allergies   Patient has no known allergies.   Review of Systems Review  of Systems  Constitutional:  Negative for chills and fever.  HENT:  Negative for ear pain and sore throat.   Eyes:  Negative for pain and visual disturbance.  Respiratory:  Negative for cough and shortness of breath.   Cardiovascular:  Negative for chest pain and palpitations.  Gastrointestinal:  Negative for abdominal pain and vomiting.  Genitourinary:  Negative for dysuria and hematuria.  Musculoskeletal:  Negative for arthralgias and back pain.  Skin:  Positive for rash. Negative for color change.  Neurological:  Negative for seizures and syncope.  All other systems reviewed and are negative.    Physical Exam Triage Vital Signs ED Triage Vitals  Encounter Vitals Group     BP 01/17/23 1425 (!) 149/76     Systolic BP Percentile --      Diastolic BP Percentile --      Pulse Rate 01/17/23 1425 72     Resp 01/17/23 1425 16     Temp 01/17/23 1425 98.7 F (37.1 C)     Temp Source 01/17/23 1425 Oral     SpO2 01/17/23 1425 98 %     Weight --      Height --      Head Circumference --      Peak Flow --      Pain Score 01/17/23 1418 0     Pain Loc --      Pain Education --      Exclude from Growth Chart --    No data found.  Updated Vital Signs BP (!) 149/76 (BP Location: Left Arm)   Pulse 72   Temp 98.7 F (37.1 C) (Oral)   Resp 16   SpO2 98%   Visual Acuity Right Eye Distance:   Left Eye Distance:   Bilateral Distance:    Right Eye Near:   Left Eye Near:    Bilateral Near:     Physical Exam Vitals and nursing note reviewed.  Constitutional:      General: She is not in acute distress.    Appearance: She is well-developed.  HENT:     Head: Normocephalic and atraumatic.     Comments: No swelling on the face or oral region Eyes:     Conjunctiva/sclera: Conjunctivae normal.  Cardiovascular:     Rate and Rhythm: Normal rate and  regular rhythm.     Heart sounds: No murmur heard. Pulmonary:     Effort: Pulmonary effort is normal. No respiratory distress.     Breath sounds: Normal breath sounds.  Abdominal:     Palpations: Abdomen is soft.     Tenderness: There is no abdominal tenderness.  Musculoskeletal:        General: No swelling.     Cervical back: Neck supple.  Skin:    General: Skin is warm and dry.     Capillary Refill: Capillary refill takes less than 2 seconds.     Comments: Erythematous rash along the upper back, neck, mid back, thighs and hips.  No visible rash on the face.  Neurological:     Mental Status: She is alert.  Psychiatric:        Mood and Affect: Mood normal.      UC Treatments / Results  Labs (all labs ordered are listed, but only abnormal results are displayed) Labs Reviewed - No data to display  EKG   Radiology No results found.  Procedures Procedures (including critical care time)  Medications Ordered in UC Medications - No data to display  Initial Impression / Assessment and Plan / UC Course  I have reviewed the triage vital signs and the nursing notes.  Pertinent labs & imaging results that were available during my care of the patient were reviewed by me and considered in my medical decision making (see chart for details).     Allergic reaction to food, initial encounter   Rash is most consistent with an allergic reaction possibly due to the recent shellfish. No respiratory issues or swelling. Given the extent of the rash we will treat with a steroid to reduce inflammation:   Prednisone taper Take 6 tabs by mouth daily for 2 days, then 5 tabs for 2 days, then 4 tabs for 2 days, then 3 tabs for 2 days, 2 tabs for 2 days, then 1 tab by mouth daily for 2 days Triamcinolone cream twice daily to the affect areas. DO not apply to the face.  Return to urgent care or PCP if symptoms worsen or fail to resolve.   Final Clinical Impressions(s) / UC Diagnoses   Final  diagnoses:  Allergic reaction to food, initial encounter     Discharge Instructions      Rash is most consistent with an allergic reaction. Given the extent of the rash we will treat with a steroid to reduce inflammation:   Prednisone taper Take 6 tabs by mouth daily for 2 days, then 5 tabs for 2 days, then 4 tabs for 2 days, then 3 tabs for 2 days, 2 tabs for 2 days, then 1 tab by mouth daily for 2 days Triamcinolone cream twice daily to the affect areas. DO not apply to the face.  Return to urgent care or PCP if symptoms worsen or fail to resolve.     ED Prescriptions     Medication Sig Dispense Auth. Provider   predniSONE (STERAPRED UNI-PAK 21 TAB) 10 MG (21) TBPK tablet Take by mouth daily. Take 6 tabs by mouth daily for 2 days, then 5 tabs for 2 days, then 4 tabs for 2 days, then 3 tabs for 2 days, 2 tabs for 2 days, then 1 tab by mouth daily for 2 days 42 tablet Charniece Venturino A, PA-C   triamcinolone cream (KENALOG) 0.1 % Apply 1 Application topically 2 (two) times daily. Twice daily as needed for itching. Do not apply to the face 454 g Landis Martins, PA-C      PDMP not reviewed this encounter.   Landis Martins, [place] 01/17/23 1443

## 2023-01-17 NOTE — Telephone Encounter (Signed)
Copied from CRM [id number]. Topic: Clinical - Red Word Triage >> Jan 17, 2023 11:51 AM Chantha C wrote: Red Word that prompted transfer to Nurse Triage: Pt c/o rash under neck, spine, itchy and looks like scratches- dtr said it could be shingles.  Chief Complaint: rash / possible shingles Symptoms: red rash all over body, itchy throat and puffy eyes - rash itches and pt is scratching Frequency: ongoing and spreading Pertinent Negatives: Patient denies SOB Disposition: [] ED /[x] Urgent Care (no appt availability in office) / [] Appointment(In office/virtual)/ []  [facility]/ [] Home Care/ [] Refused Recommended Disposition /[] [facility]/ []  Follow-up with PCP Additional Notes: pt stated unsure where rash come from: her daughter stated could be shingles but unsure.  Nurse advised to go to urgent due to no appts available at present time.  Home care advice given also.  Pt verbalized understanding.  Reason for Disposition  [1] Pimples (localized) AND Daffy.[place]ade ] no improvement after using Care Advice  Answer Assessment - Initial Assessment Questions 1. APPEARANCE of RASH: "Describe the rash."       2. LOCATION: "Where is the rash located?"      Spine, shoulder, neck, ears,- now itching throat, puffy eyes  3. NUMBER: "How many spots  are there?"      All over 4. SIZE: "How big are the spots?" (Inches, centimeters or compare to size of a coin)      Large spots 5. ONSET: "When did the rash start?"      X 2days ago 6. ITCHING: "Does the rash itch?" If Yes, ask: "How bad is the itch?"  (Scale 0-10; or none, mild, moderate, severe)     itching 7. PAIN: "Does the rash hurt?" If Yes, ask: "How bad is the pain?"  (Scale 0-10; or none, mild, moderate, severe)    - NONE (0): no pain    - MILD (1-3): doesn't interfere with normal activities     - MODERATE (4-7): interferes with normal activities or awakens from sleep     - SEVERE (8-10): excruciating pain, unable to do any normal  activities     No pain, no fever 8. OTHER SYMPTOMS: "Do you have any other symptoms?" (e.g., fever)     Nyosporion  9. PREGNANCY: "Is there any chance you are pregnant?" "When was your last menstrual period?"     N/a  Protocols used: Rash or Redness - Localized-A-AH

## 2023-02-23 ENCOUNTER — Encounter: Payer: BC Managed Care – PPO | Attending: Family Medicine | Admitting: Skilled Nursing Facility1

## 2023-02-23 VITALS — Ht 63.5 in | Wt 138.1 lb

## 2023-02-23 DIAGNOSIS — E785 Hyperlipidemia, unspecified: Secondary | ICD-10-CM

## 2023-02-23 NOTE — Progress Notes (Signed)
Medical Nutrition Therapy  Appointment Start time:  8:19  Appointment End time:  9:20  Primary concerns today: post menopausal weight gain   Referral diagnosis: e78.5   NUTRITION ASSESSMENT    Clinical Medical Hx: HTN, anxiety, GERD Medications: see list Labs: WNL Notable Signs/Symptoms: none reported   Lifestyle & Dietary Hx  Pt arrives with well balanced diet and exceding the physical activity recommendations.  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  Pt states she loves her pilates.   Body Composition Scale 02/23/2023  Current Body Weight 138.1  Total Body Fat % 30.6  Visceral Fat 7  Fat-Free Mass % 69.3   Total Body Water % 49.1  Muscle-Mass lbs 27.4  BMI   Body Fat Displacement          Torso  lbs          Left Leg  lbs          Right Leg  lbs          Left Arm  lbs          Right Arm   lbs     Estimated daily fluid intake: 80+ oz Supplements:  Sleep:  Stress / self-care: low Current average weekly physical activity: 3 days a week yoga, gym 30-45 minutes  24-Hr Dietary Recall First Meal: coffee + almond milk + protein Snack: fruit or bar or protein shake Second Meal 11: 1 egg + 2 egg whites + spinach and onion + sardines or tuna + bread Snack: coffee + milk then fruit 2-3 fruits Third Meal: pork meatball + salad: premixed kale salad + ground beef  Snack:  Beverages: water, wine    NUTRITION INTERVENTION  Nutrition education (E-1) on the following topics:  Menopause and its effect on metabolism and adipose distribution   Learning Style & Readiness for Change Teaching method utilized: Visual & Auditory  Demonstrated degree of understanding via: Teach Back  Barriers to learning/adherence to lifestyle change: none identified   Goals Established by Pt Try a heavier lunch to reduce over snacking before dinner Change up your cardio machines to continue  Reduce your wine to one 4 ounce serving per day   MONITORING & EVALUATION Dietary intake, weekly  physical activity  Next Steps  Patient is to call or email with any future questions or concerns.

## 2023-02-25 ENCOUNTER — Other Ambulatory Visit: Payer: Self-pay | Admitting: Family Medicine

## 2023-03-24 ENCOUNTER — Other Ambulatory Visit: Payer: Self-pay | Admitting: Family Medicine

## 2023-03-24 DIAGNOSIS — I10 Essential (primary) hypertension: Secondary | ICD-10-CM

## 2023-03-28 ENCOUNTER — Other Ambulatory Visit: Payer: Self-pay | Admitting: Family Medicine

## 2023-03-28 ENCOUNTER — Telehealth: Payer: Self-pay | Admitting: Family Medicine

## 2023-03-28 NOTE — Telephone Encounter (Signed)
 Patient called to update on medication refill. Patient instructed to wait for call from pharmacy. Per EPIC, pharmacy confirmed receipt of prescription.   Copied from CRM [id number]. Topic: Clinical - Prescription Issue >> Mar 28, 2023 10:43 AM Irine Seal wrote: Reason for CRM: The patient reported that the pharmacy submitted a new refill request for Temazepam (Restoril) 15 mg capsules today. She has only 2 pills left for today and is concerned about running out, as she does not want to go without medication for 48-72 hours. The patient mentioned that she can come in if necessary. Pt cb# [phone]

## 2023-03-28 NOTE — Telephone Encounter (Signed)
 Left detailed message for pt advised to pick up refill for Temazepam from her pharmacy

## 2023-05-12 DIAGNOSIS — H53483 Generalized contraction of visual field, bilateral: Secondary | ICD-10-CM | POA: Diagnosis not present

## 2023-05-12 DIAGNOSIS — H02834 Dermatochalasis of left upper eyelid: Secondary | ICD-10-CM | POA: Diagnosis not present

## 2023-05-12 DIAGNOSIS — H02831 Dermatochalasis of right upper eyelid: Secondary | ICD-10-CM | POA: Diagnosis not present

## 2023-05-12 DIAGNOSIS — H0279 Other degenerative disorders of eyelid and periocular area: Secondary | ICD-10-CM | POA: Diagnosis not present

## 2023-05-12 DIAGNOSIS — H02413 Mechanical ptosis of bilateral eyelids: Secondary | ICD-10-CM | POA: Diagnosis not present

## 2023-06-06 ENCOUNTER — Encounter (HOSPITAL_COMMUNITY): Payer: Self-pay

## 2023-06-13 ENCOUNTER — Encounter: Admitting: Family Medicine

## 2023-06-13 DIAGNOSIS — H53483 Generalized contraction of visual field, bilateral: Secondary | ICD-10-CM | POA: Diagnosis not present

## 2023-06-15 ENCOUNTER — Ambulatory Visit (INDEPENDENT_AMBULATORY_CARE_PROVIDER_SITE_OTHER): Admitting: Family Medicine

## 2023-06-15 ENCOUNTER — Encounter: Payer: Self-pay | Admitting: Family Medicine

## 2023-06-15 VITALS — BP 120/70 | HR 71 | Temp 97.7°F | Ht 63.5 in | Wt 137.0 lb

## 2023-06-15 DIAGNOSIS — Z0000 Encounter for general adult medical examination without abnormal findings: Secondary | ICD-10-CM | POA: Diagnosis not present

## 2023-06-15 DIAGNOSIS — I10 Essential (primary) hypertension: Secondary | ICD-10-CM

## 2023-06-15 DIAGNOSIS — Z131 Encounter for screening for diabetes mellitus: Secondary | ICD-10-CM | POA: Diagnosis not present

## 2023-06-15 DIAGNOSIS — Z13220 Encounter for screening for lipoid disorders: Secondary | ICD-10-CM | POA: Diagnosis not present

## 2023-06-15 LAB — CBC WITH DIFFERENTIAL/PLATELET
Basophils Absolute: 0 10*3/uL (ref 0.0–0.1)
Basophils Relative: 0.5 % (ref 0.0–3.0)
Eosinophils Absolute: 0.3 10*3/uL (ref 0.0–0.7)
Eosinophils Relative: 5.6 % — ABNORMAL HIGH (ref 0.0–5.0)
HCT: 41.8 % (ref 36.0–46.0)
Hemoglobin: 13.6 g/dL (ref 12.0–15.0)
Lymphocytes Relative: 39.7 % (ref 12.0–46.0)
Lymphs Abs: 1.8 10*3/uL (ref 0.7–4.0)
MCHC: 32.5 g/dL (ref 30.0–36.0)
MCV: 87 fl (ref 78.0–100.0)
Monocytes Absolute: 0.3 10*3/uL (ref 0.1–1.0)
Monocytes Relative: 6.1 % (ref 3.0–12.0)
Neutro Abs: 2.2 10*3/uL (ref 1.4–7.7)
Neutrophils Relative %: 48.1 % (ref 43.0–77.0)
Platelets: 213 10*3/uL (ref 150.0–400.0)
RBC: 4.81 Mil/uL (ref 3.87–5.11)
RDW: 14.4 % (ref 11.5–15.5)
WBC: 4.6 10*3/uL (ref 4.0–10.5)

## 2023-06-15 LAB — LIPID PANEL
Cholesterol: 200 mg/dL (ref 0–200)
HDL: 76.6 mg/dL (ref >39.00)
LDL Cholesterol: 108 mg/dL — ABNORMAL HIGH (ref 0–99)
NonHDL: 123.62
Total CHOL/HDL Ratio: 3
Triglycerides: 80 mg/dL (ref 0.0–149.0)
VLDL: 16 mg/dL (ref 0.0–40.0)

## 2023-06-15 LAB — BASIC METABOLIC PANEL WITH GFR
BUN: 16 mg/dL (ref 6–23)
CO2: 28 meq/L (ref 19–32)
Calcium: 8.8 mg/dL (ref 8.4–10.5)
Chloride: 102 meq/L (ref 96–112)
Creatinine, Ser: 0.79 mg/dL (ref 0.40–1.20)
GFR: 80.71 mL/min (ref >60.00)
Glucose, Bld: 102 mg/dL — ABNORMAL HIGH (ref 70–99)
Potassium: 3.9 meq/L (ref 3.5–5.1)
Sodium: 137 meq/L (ref 135–145)

## 2023-06-15 LAB — HEMOGLOBIN A1C: Hgb A1c MFr Bld: 5.4 % (ref 4.6–6.5)

## 2023-06-15 LAB — HEPATIC FUNCTION PANEL
ALT: 25 U/L (ref 0–35)
AST: 30 U/L (ref 0–37)
Albumin: 4.8 g/dL (ref 3.5–5.2)
Alkaline Phosphatase: 56 U/L (ref 39–117)
Bilirubin, Direct: 0.1 mg/dL (ref 0.0–0.3)
Total Bilirubin: 0.5 mg/dL (ref 0.2–1.2)
Total Protein: 7.6 g/dL (ref 6.0–8.3)

## 2023-06-15 LAB — TSH: TSH: 2.46 u[IU]/mL (ref 0.35–5.50)

## 2023-06-15 MED ORDER — LISINOPRIL 10 MG PO TABS: ORAL_TABLET | ORAL | 3 refills | Status: DC

## 2023-06-15 MED ORDER — ATORVASTATIN CALCIUM 10 MG PO TABS: 10.0000 mg | ORAL_TABLET | Freq: Every day | ORAL | 3 refills | Status: AC

## 2023-06-15 NOTE — Progress Notes (Signed)
   Subjective:    Patient ID: Bethany Wade, female    DOB: January 13, 1961, 63 y.o.   MRN: [id number]  HPI Here for a well exam. She feels great.    Review of Systems  Constitutional: Negative.   HENT: Negative.    Eyes: Negative.   Respiratory: Negative.    Cardiovascular: Negative.   Gastrointestinal: Negative.   Genitourinary:  Negative for decreased urine volume, difficulty urinating, dyspareunia, dysuria, enuresis, flank pain, frequency, hematuria, pelvic pain and urgency.  Musculoskeletal: Negative.   Skin: Negative.   Neurological: Negative.  Negative for headaches.  Psychiatric/Behavioral: Negative.         Objective:   Physical Exam Constitutional:      General: She is not in acute distress.    Appearance: Normal appearance. She is well-developed.  HENT:     Head: Normocephalic and atraumatic.     Right Ear: External ear normal.     Left Ear: External ear normal.     Nose: Nose normal.     Mouth/Throat:     Pharynx: No oropharyngeal exudate.  Eyes:     General: No scleral icterus.    Conjunctiva/sclera: Conjunctivae normal.     Pupils: Pupils are equal, round, and reactive to light.  Neck:     Thyroid : No thyromegaly.     Vascular: No JVD.  Cardiovascular:     Rate and Rhythm: Normal rate and regular rhythm.     Heart sounds: Normal heart sounds. No murmur heard.    No friction rub. No gallop.  Pulmonary:     Effort: Pulmonary effort is normal. No respiratory distress.     Breath sounds: Normal breath sounds. No wheezing or rales.  Chest:     Chest wall: No tenderness.  Abdominal:     General: Bowel sounds are normal. There is no distension.     Palpations: Abdomen is soft. There is no mass.     Tenderness: There is no abdominal tenderness. There is no guarding or rebound.  Musculoskeletal:        General: No tenderness. Normal range of motion.     Cervical back: Normal range of motion and neck supple.  Lymphadenopathy:     Cervical: No cervical  adenopathy.  Skin:    General: Skin is warm and dry.     Findings: No erythema or rash.  Neurological:     General: No focal deficit present.     Mental Status: She is alert and oriented to person, place, and time.     Cranial Nerves: No cranial nerve deficit.     Motor: No abnormal muscle tone.     Coordination: Coordination normal.     Deep Tendon Reflexes: Reflexes are normal and symmetric. Reflexes normal.  Psychiatric:        Mood and Affect: Mood normal.        Behavior: Behavior normal.        Thought Content: Thought content normal.        Judgment: Judgment normal.           Assessment & Plan:  Well exam. We discussed diet and exercise. Get fasting labs. Corita Diego, MD

## 2023-06-17 ENCOUNTER — Ambulatory Visit: Payer: Self-pay | Admitting: Family Medicine

## 2023-06-22 ENCOUNTER — Other Ambulatory Visit: Payer: Self-pay | Admitting: Family Medicine

## 2023-06-22 DIAGNOSIS — I10 Essential (primary) hypertension: Secondary | ICD-10-CM

## 2023-07-04 NOTE — Telephone Encounter (Signed)
 Actually her glucose control is excellent. The A1c is a better measure than a single glucose. As for the LDL, limit the intake of fatty foods like fried foods, meats like bacon or sausage, and dairy products

## 2023-07-06 DIAGNOSIS — H02834 Dermatochalasis of left upper eyelid: Secondary | ICD-10-CM | POA: Diagnosis not present

## 2023-07-06 DIAGNOSIS — H53483 Generalized contraction of visual field, bilateral: Secondary | ICD-10-CM | POA: Diagnosis not present

## 2023-07-06 DIAGNOSIS — H02831 Dermatochalasis of right upper eyelid: Secondary | ICD-10-CM | POA: Diagnosis not present

## 2023-08-08 ENCOUNTER — Encounter: Payer: Self-pay | Admitting: Family Medicine

## 2023-08-08 ENCOUNTER — Ambulatory Visit (INDEPENDENT_AMBULATORY_CARE_PROVIDER_SITE_OTHER): Payer: Self-pay | Admitting: Family Medicine

## 2023-08-08 VITALS — BP 120/78 | HR 72 | Temp 98.4°F | Wt 142.2 lb

## 2023-08-08 DIAGNOSIS — H60592 Other noninfective acute otitis externa, left ear: Secondary | ICD-10-CM | POA: Diagnosis not present

## 2023-08-08 NOTE — Progress Notes (Signed)
   Subjective:    Patient ID: Bethany Wade, female    DOB: May 24, 1960, 63 y.o.   MRN: [id number]  HPI Here for pain in the left ear that began 5 days ago. This started shortly after she used her finger to scratch the external canal due to itching. She had moderate pain and some decreased hearing out of that ear, but these symptoms have been slowly improving. She feels fine in general.    Review of Systems  Constitutional: Negative.   HENT:  Positive for ear pain and hearing loss. Negative for congestion, ear discharge, facial swelling, postnasal drip, sinus pain and sore throat.   Eyes: Negative.   Respiratory: Negative.         Objective:   Physical Exam Constitutional:      Appearance: Normal appearance.  HENT:     Right Ear: Tympanic membrane, ear canal and external ear normal.     Left Ear: Tympanic membrane normal.     Ears:     Comments: Left ear canal is red and slightly swollen     Nose: Nose normal.     Mouth/Throat:     Pharynx: Oropharynx is clear.  Eyes:     Conjunctiva/sclera: Conjunctivae normal.  Pulmonary:     Effort: Pulmonary effort is normal.     Breath sounds: Normal breath sounds.  Lymphadenopathy:     Cervical: No cervical adenopathy.  Neurological:     Mental Status: She is alert.           Assessment & Plan:  She has scratched the left ear canal, causing some swelling. This should heal up on its own over the next few days. Recheck as needed.  Garnette Olmsted, MD

## 2023-08-15 ENCOUNTER — Telehealth: Payer: Self-pay

## 2023-08-15 DIAGNOSIS — H9202 Otalgia, left ear: Secondary | ICD-10-CM

## 2023-08-15 NOTE — Telephone Encounter (Signed)
 Spoke with pt voiced understanding that she will get a call for scheduling

## 2023-08-15 NOTE — Telephone Encounter (Signed)
 Copied from CRM [id number]. Topic: Referral - Question >> Aug 15, 2023 10:53 AM Chasity T wrote: Reason for CRM: Patient is calling in because she is wanting an referral regarding an ENT because the medication is not working and symptoms are not getting better in ear.

## 2023-08-15 NOTE — Telephone Encounter (Signed)
I did the referral to ENT  

## 2023-08-15 NOTE — Addendum Note (Signed)
 Addended by: JOHNNY SENIOR A on: 08/15/2023 04:43 PM   Modules accepted: Orders

## 2023-08-16 DIAGNOSIS — Z1231 Encounter for screening mammogram for malignant neoplasm of breast: Secondary | ICD-10-CM | POA: Diagnosis not present

## 2023-08-16 DIAGNOSIS — Z01419 Encounter for gynecological examination (general) (routine) without abnormal findings: Secondary | ICD-10-CM | POA: Diagnosis not present

## 2023-08-16 DIAGNOSIS — Z6824 Body mass index (BMI) 24.0-24.9, adult: Secondary | ICD-10-CM | POA: Diagnosis not present

## 2023-08-17 DIAGNOSIS — H748X2 Other specified disorders of left middle ear and mastoid: Secondary | ICD-10-CM | POA: Diagnosis not present

## 2023-09-28 ENCOUNTER — Other Ambulatory Visit: Payer: Self-pay | Admitting: Family Medicine

## 2023-09-28 NOTE — Telephone Encounter (Unsigned)
 Copied from CRM #[id number]. Topic: Clinical - Medication Refill >> Sep 28, 2023 10:41 AM Tinnie C wrote: Medication: temazepam  (RESTORIL ) 15 MG capsule  Has the patient contacted their pharmacy? Yes (Agent: If no, request that the patient contact the pharmacy for the refill. If patient does not wish to contact the pharmacy document the reason why and proceed with request.) (Agent: If yes, when and what did the pharmacy advise?)  They said they would call us  but hadn't yet  This is the patient's preferred pharmacy:  [id number] [place], [place] - [address] AT [facility] [address] & [facility] [address] [place] [address] Phone: [phone] Fax: [phone]  Is this the correct pharmacy for this prescription? Yes If no, delete pharmacy and type the correct one.   Has the prescription been filled recently? Yes  Is the patient out of the medication? Yes  Has the patient been seen for an appointment in the last year OR does the patient have an upcoming appointment? Yes  Can we respond through MyChart? No  Agent: Please be advised that Rx refills may take up to 3 business days. We ask that you follow-up with your pharmacy.

## 2023-10-03 ENCOUNTER — Ambulatory Visit: Admitting: Family Medicine

## 2023-10-06 DIAGNOSIS — H6992 Unspecified Eustachian tube disorder, left ear: Secondary | ICD-10-CM | POA: Diagnosis not present

## 2023-10-20 DIAGNOSIS — H0279 Other degenerative disorders of eyelid and periocular area: Secondary | ICD-10-CM | POA: Diagnosis not present

## 2023-10-20 DIAGNOSIS — H02831 Dermatochalasis of right upper eyelid: Secondary | ICD-10-CM | POA: Diagnosis not present

## 2023-10-20 DIAGNOSIS — H57813 Brow ptosis, bilateral: Secondary | ICD-10-CM | POA: Diagnosis not present

## 2023-10-20 DIAGNOSIS — H02834 Dermatochalasis of left upper eyelid: Secondary | ICD-10-CM | POA: Diagnosis not present

## 2023-12-27 ENCOUNTER — Encounter: Payer: Self-pay | Admitting: Family Medicine

## 2023-12-27 ENCOUNTER — Ambulatory Visit: Payer: Self-pay | Admitting: Family Medicine

## 2023-12-27 ENCOUNTER — Ambulatory Visit: Admitting: Family Medicine

## 2023-12-27 VITALS — BP 132/80 | HR 68 | Temp 98.5°F | Wt 136.1 lb

## 2023-12-27 DIAGNOSIS — E538 Deficiency of other specified B group vitamins: Secondary | ICD-10-CM

## 2023-12-27 DIAGNOSIS — R5383 Other fatigue: Secondary | ICD-10-CM

## 2023-12-27 DIAGNOSIS — E559 Vitamin D deficiency, unspecified: Secondary | ICD-10-CM | POA: Diagnosis not present

## 2023-12-27 DIAGNOSIS — R739 Hyperglycemia, unspecified: Secondary | ICD-10-CM

## 2023-12-27 LAB — VITAMIN D 25 HYDROXY (VIT D DEFICIENCY, FRACTURES): VITD: 46.02 ng/mL (ref 30.00–100.00)

## 2023-12-27 LAB — CBC WITH DIFFERENTIAL/PLATELET
Basophils Absolute: 0 K/uL (ref 0.0–0.1)
Basophils Relative: 0.3 % (ref 0.0–3.0)
Eosinophils Absolute: 0.1 K/uL (ref 0.0–0.7)
Eosinophils Relative: 3.1 % (ref 0.0–5.0)
HCT: 41.8 % (ref 36.0–46.0)
Hemoglobin: 13.8 g/dL (ref 12.0–15.0)
Lymphocytes Relative: 33.1 % (ref 12.0–46.0)
Lymphs Abs: 1.6 K/uL (ref 0.7–4.0)
MCHC: 33 g/dL (ref 30.0–36.0)
MCV: 86.1 fl (ref 78.0–100.0)
Monocytes Absolute: 0.3 K/uL (ref 0.1–1.0)
Monocytes Relative: 6.3 % (ref 3.0–12.0)
Neutro Abs: 2.7 K/uL (ref 1.4–7.7)
Neutrophils Relative %: 57.2 % (ref 43.0–77.0)
Platelets: 212 K/uL (ref 150.0–400.0)
RBC: 4.86 Mil/uL (ref 3.87–5.11)
RDW: 13.2 % (ref 11.5–15.5)
WBC: 4.7 K/uL (ref 4.0–10.5)

## 2023-12-27 LAB — HEPATIC FUNCTION PANEL
ALT: 22 U/L (ref 0–35)
AST: 21 U/L (ref 0–37)
Albumin: 4.6 g/dL (ref 3.5–5.2)
Alkaline Phosphatase: 60 U/L (ref 39–117)
Bilirubin, Direct: 0.1 mg/dL (ref 0.0–0.3)
Total Bilirubin: 0.5 mg/dL (ref 0.2–1.2)
Total Protein: 7.4 g/dL (ref 6.0–8.3)

## 2023-12-27 LAB — BASIC METABOLIC PANEL WITH GFR
BUN: 13 mg/dL (ref 6–23)
CO2: 33 meq/L — ABNORMAL HIGH (ref 19–32)
Calcium: 9 mg/dL (ref 8.4–10.5)
Chloride: 102 meq/L (ref 96–112)
Creatinine, Ser: 0.81 mg/dL (ref 0.40–1.20)
GFR: 78.03 mL/min (ref >60.00)
Glucose, Bld: 100 mg/dL — ABNORMAL HIGH (ref 70–99)
Potassium: 4.6 meq/L (ref 3.5–5.1)
Sodium: 140 meq/L (ref 135–145)

## 2023-12-27 LAB — TSH: TSH: 2.18 u[IU]/mL (ref 0.35–5.50)

## 2023-12-27 LAB — HEMOGLOBIN A1C: Hgb A1c MFr Bld: 5.5 % (ref 4.6–6.5)

## 2023-12-27 LAB — VITAMIN B12: Vitamin B-12: 1210 pg/mL — ABNORMAL HIGH (ref 211–911)

## 2023-12-27 NOTE — Progress Notes (Signed)
   Subjective:    Patient ID: Bethany Wade, female    DOB: Apr 30, 1960, 63 y.o.   MRN: [id number]  HPI Here to discuss some changes she has noticed the past month, including fatigue, mild confusion, and brain fog. She sleeps well. Her diet and medications have not changed. She takes some OTC supplements including B12, calcium , vitamin D, magnesium, and CoQ 10.    Review of Systems  Constitutional:  Positive for fatigue.  Respiratory: Negative.    Cardiovascular: Negative.   Gastrointestinal: Negative.   Genitourinary: Negative.   Neurological: Negative.   Psychiatric/Behavioral:  Positive for confusion and decreased concentration. Negative for behavioral problems and dysphoric mood. The patient is not nervous/anxious.        Objective:   Physical Exam        Assessment & Plan:  Confusion and fatigue. We will check labs today including some vitamin levels.  Garnette Olmsted, MD

## 2023-12-30 ENCOUNTER — Ambulatory Visit: Admitting: Family Medicine

## 2023-12-30 ENCOUNTER — Encounter: Payer: Self-pay | Admitting: Family Medicine

## 2023-12-30 VITALS — BP 130/80 | HR 80 | Temp 98.1°F | Ht 63.5 in | Wt 134.6 lb

## 2023-12-30 DIAGNOSIS — I10 Essential (primary) hypertension: Secondary | ICD-10-CM

## 2023-12-30 DIAGNOSIS — R5383 Other fatigue: Secondary | ICD-10-CM

## 2023-12-30 MED ORDER — LISINOPRIL 10 MG PO TABS: ORAL_TABLET | ORAL | Status: AC

## 2023-12-30 NOTE — Progress Notes (Signed)
   Subjective:    Patient ID: Bethany Wade, female    DOB: [birth year], 63 y.o.   MRN: [id number]  HPI Here to follow up on our visit on 12-27-23. That day she was complaining of fatigue and difficulty concentrating. We did lab work that was remarkable only for a mildly high B12 level. She had been taking a numbner of OTC supplements that contained B12, so she has stopped taking all these. In addition, she realized she has been drinking more alcohol than usual for the past few months, and she has been drinking every day. Since our last visit, she has not had any alcohol, and she feels much better. She has more energy and her thinking is clear.    Review of Systems  Constitutional: Negative.   Respiratory: Negative.    Cardiovascular: Negative.   Neurological: Negative.        Objective:   Physical Exam Constitutional:      Appearance: Normal appearance.  Cardiovascular:     Rate and Rhythm: Normal rate and regular rhythm.     Pulses: Normal pulses.     Heart sounds: Normal heart sounds.  Pulmonary:     Effort: Pulmonary effort is normal.     Breath sounds: Normal breath sounds.  Neurological:     Mental Status: She is alert and oriented to person, place, and time.           Assessment & Plan:  We agreed that the source of her fatigue and concentration issues was overuse of alcohol. I advised her to be careful with alcohol consumption in the future and to avoid using this every day. She will also stay off all supplements that contain B12.  Garnette Olmsted, MD
